# Patient Record
Sex: Male | Born: 1985 | Race: White | Hispanic: No | Marital: Single | Smoking: Never smoker
Health system: Southern US, Community
[De-identification: ages and names within clinical notes are randomized; demographics above are authoritative.]

## PROBLEM LIST (undated history)

## (undated) DIAGNOSIS — I10 Essential (primary) hypertension: Secondary | ICD-10-CM

## (undated) DIAGNOSIS — J45909 Unspecified asthma, uncomplicated: Secondary | ICD-10-CM

## (undated) DIAGNOSIS — F4310 Post-traumatic stress disorder, unspecified: Secondary | ICD-10-CM

## (undated) DIAGNOSIS — F419 Anxiety disorder, unspecified: Secondary | ICD-10-CM

## (undated) HISTORY — PX: NO PAST SURGERIES: SHX2092

---

## 2009-09-30 ENCOUNTER — Emergency Department (HOSPITAL_COMMUNITY): Admission: EM | Admit: 2009-09-30 | Discharge: 2009-10-01 | Payer: Self-pay | Admitting: Emergency Medicine

## 2012-03-23 ENCOUNTER — Emergency Department (HOSPITAL_COMMUNITY)

## 2012-03-23 ENCOUNTER — Emergency Department (HOSPITAL_COMMUNITY)
Admission: EM | Admit: 2012-03-23 | Discharge: 2012-03-23 | Disposition: A | Discharge status: Home / Self Care | Attending: Emergency Medicine | Admitting: Emergency Medicine

## 2012-03-23 ENCOUNTER — Encounter (HOSPITAL_COMMUNITY): Payer: Self-pay | Admitting: Family Medicine

## 2012-03-23 DIAGNOSIS — IMO0002 Reserved for concepts with insufficient information to code with codable children: Secondary | ICD-10-CM | POA: Insufficient documentation

## 2012-03-23 DIAGNOSIS — Y9389 Activity, other specified: Secondary | ICD-10-CM | POA: Insufficient documentation

## 2012-03-23 LAB — CBC WITH DIFFERENTIAL/PLATELET
Basophils Absolute: 0 10*3/uL (ref 0.0–0.1)
Eosinophils Absolute: 0.1 10*3/uL (ref 0.0–0.7)
HCT: 46.2 % (ref 39.0–52.0)
Hemoglobin: 16.8 g/dL (ref 13.0–17.0)
Lymphs Abs: 1.6 10*3/uL (ref 0.7–4.0)
MCV: 87.8 fL (ref 78.0–100.0)
Monocytes Absolute: 0.4 10*3/uL (ref 0.1–1.0)
RDW: 12.6 % (ref 11.5–15.5)

## 2012-03-23 LAB — POCT I-STAT, CHEM 8
Calcium, Ion: 1.19 mmol/L (ref 1.12–1.23)
Chloride: 103 mEq/L (ref 96–112)
Glucose, Bld: 127 mg/dL — ABNORMAL HIGH (ref 70–99)
Hemoglobin: 16.3 g/dL (ref 13.0–17.0)
Potassium: 3.9 mEq/L (ref 3.5–5.1)
Sodium: 141 mEq/L (ref 135–145)

## 2012-03-23 LAB — COMPREHENSIVE METABOLIC PANEL
Albumin: 4.3 g/dL (ref 3.5–5.2)
Alkaline Phosphatase: 86 U/L (ref 39–117)
CO2: 29 mEq/L (ref 19–32)
Calcium: 9.4 mg/dL (ref 8.4–10.5)
Chloride: 101 mEq/L (ref 96–112)
Creatinine, Ser: 0.88 mg/dL (ref 0.50–1.35)
GFR calc Af Amer: >90 mL/min (ref >90)
Sodium: 138 mEq/L (ref 135–145)
Total Bilirubin: 0.6 mg/dL (ref 0.3–1.2)

## 2012-03-23 MED ORDER — MORPHINE SULFATE 4 MG/ML IJ SOLN: 4.0000 mg | Freq: Once | INTRAMUSCULAR | Status: DC

## 2012-03-23 MED ORDER — HYDROMORPHONE HCL PF 1 MG/ML IJ SOLN
1.0000 mg | Freq: Once | INTRAMUSCULAR | Status: AC
  Administered 2012-03-23: 1 mg via INTRAVENOUS

## 2012-03-23 MED ORDER — IOHEXOL 300 MG/ML  SOLN
80.0000 mL | Freq: Once | INTRAMUSCULAR | Status: AC | PRN
  Administered 2012-03-23: 80 mL via INTRAVENOUS

## 2012-03-23 MED ORDER — HYDROMORPHONE HCL PF 1 MG/ML IJ SOLN
1.0000 mg | Freq: Once | INTRAMUSCULAR | Status: DC
  Filled 2012-03-23: qty 1

## 2012-03-23 MED ORDER — HYDROMORPHONE HCL PF 1 MG/ML IJ SOLN
1.0000 mg | Freq: Once | INTRAMUSCULAR | Status: AC
  Administered 2012-03-23: 1 mg via INTRAVENOUS
  Filled 2012-03-23: qty 1

## 2012-03-23 MED ORDER — HYDROCODONE-ACETAMINOPHEN 5-500 MG PO TABS: 1.0000 | ORAL_TABLET | Freq: Four times a day (QID) | ORAL | Status: DC | PRN

## 2012-03-23 MED ORDER — ONDANSETRON HCL 4 MG/2ML IJ SOLN
4.0000 mg | Freq: Once | INTRAMUSCULAR | Status: DC
  Filled 2012-03-23: qty 2

## 2012-03-23 MED ORDER — ONDANSETRON HCL 4 MG/2ML IJ SOLN
4.0000 mg | Freq: Once | INTRAMUSCULAR | Status: AC
  Administered 2012-03-23: 4 mg via INTRAVENOUS
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
## 2012-03-23 NOTE — ED Provider Notes (Signed)
I saw and evaluated the patient, reviewed the resident's note and I agree with the findings and plan.   Shelda Jakes, MD 03/23/12 [phone]
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
## 2012-03-23 NOTE — ED Notes (Signed)
Transported to CT 

## 2012-03-23 NOTE — Progress Notes (Signed)
Orthopedic Tech Progress Note Patient Details:  Noah Ford 1985-10-02 [id number]  Ortho Devices Type of Ortho Device: Crutches Ortho Device/Splint Interventions: Application   Nikki Dom 03/23/2012, 6:19 PM
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
## 2012-03-25 ENCOUNTER — Encounter (HOSPITAL_COMMUNITY): Payer: Self-pay | Admitting: *Deleted

## 2012-03-25 ENCOUNTER — Emergency Department (HOSPITAL_COMMUNITY)
Admission: EM | Admit: 2012-03-25 | Discharge: 2012-03-25 | Disposition: A | Discharge status: Home / Self Care | Attending: Emergency Medicine | Admitting: Emergency Medicine

## 2012-03-25 ENCOUNTER — Emergency Department (HOSPITAL_COMMUNITY)

## 2012-03-25 DIAGNOSIS — M545 Low back pain, unspecified: Secondary | ICD-10-CM | POA: Insufficient documentation

## 2012-03-25 DIAGNOSIS — Y9239 Other specified sports and athletic area as the place of occurrence of the external cause: Secondary | ICD-10-CM | POA: Insufficient documentation

## 2012-03-25 DIAGNOSIS — M25519 Pain in unspecified shoulder: Secondary | ICD-10-CM | POA: Insufficient documentation

## 2012-03-25 DIAGNOSIS — Y92838 Other recreation area as the place of occurrence of the external cause: Secondary | ICD-10-CM | POA: Insufficient documentation

## 2012-03-25 DIAGNOSIS — Y9389 Activity, other specified: Secondary | ICD-10-CM | POA: Insufficient documentation

## 2012-03-25 DIAGNOSIS — R209 Unspecified disturbances of skin sensation: Secondary | ICD-10-CM | POA: Insufficient documentation

## 2012-03-25 DIAGNOSIS — M25559 Pain in unspecified hip: Secondary | ICD-10-CM | POA: Insufficient documentation

## 2012-03-25 MED ORDER — IBUPROFEN 200 MG PO TABS
800.0000 mg | ORAL_TABLET | Freq: Once | ORAL | Status: AC
  Administered 2012-03-25: 800 mg via ORAL
  Filled 2012-03-25: qty 4

## 2012-03-25 MED ORDER — IBUPROFEN 800 MG PO TABS: 800.0000 mg | ORAL_TABLET | Freq: Three times a day (TID) | ORAL | Status: DC

## 2012-03-25 NOTE — ED Notes (Signed)
Pt c/o constant 10/10 sharp pain unrelieved with Vicodin to left shoulder, left hip, left knee, and lower back, with intermittent numbness and tingling to left big toe. Pt A&Ox4, uses crutches to ambulate short distances. Family at bedside.
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
## 2012-03-26 NOTE — ED Provider Notes (Signed)
Medical screening examination/treatment/procedure(s) were performed by non-physician practitioner and as supervising physician I was immediately available for consultation/collaboration.  Bartlomiej Jenkinson, MD 03/26/12 2339 

## 2012-05-09 ENCOUNTER — Encounter (HOSPITAL_COMMUNITY): Payer: Self-pay | Admitting: Emergency Medicine

## 2012-05-09 ENCOUNTER — Emergency Department (HOSPITAL_COMMUNITY)
Admission: EM | Admit: 2012-05-09 | Discharge: 2012-05-10 | Disposition: A | Discharge status: Home / Self Care | Attending: Emergency Medicine | Admitting: Emergency Medicine

## 2012-05-09 DIAGNOSIS — R45851 Suicidal ideations: Secondary | ICD-10-CM

## 2012-05-09 DIAGNOSIS — I10 Essential (primary) hypertension: Secondary | ICD-10-CM | POA: Insufficient documentation

## 2012-05-09 DIAGNOSIS — Z8659 Personal history of other mental and behavioral disorders: Secondary | ICD-10-CM | POA: Insufficient documentation

## 2012-05-09 DIAGNOSIS — F329 Major depressive disorder, single episode, unspecified: Secondary | ICD-10-CM | POA: Insufficient documentation

## 2012-05-09 DIAGNOSIS — F3289 Other specified depressive episodes: Secondary | ICD-10-CM | POA: Insufficient documentation

## 2012-05-09 DIAGNOSIS — F4310 Post-traumatic stress disorder, unspecified: Secondary | ICD-10-CM | POA: Diagnosis present

## 2012-05-09 DIAGNOSIS — F411 Generalized anxiety disorder: Secondary | ICD-10-CM | POA: Insufficient documentation

## 2012-05-09 HISTORY — DX: Anxiety disorder, unspecified: F41.9

## 2012-05-09 HISTORY — DX: Essential (primary) hypertension: I10

## 2012-05-09 HISTORY — DX: Post-traumatic stress disorder, unspecified: F43.10

## 2012-05-09 LAB — CBC
HCT: 46.1 % (ref 39.0–52.0)
MCV: 86.8 fL (ref 78.0–100.0)
RBC: 5.31 MIL/uL (ref 4.22–5.81)
WBC: 5.7 10*3/uL (ref 4.0–10.5)

## 2012-05-09 LAB — RAPID URINE DRUG SCREEN, HOSP PERFORMED
Amphetamines: NOT DETECTED
Benzodiazepines: NOT DETECTED
Cocaine: NOT DETECTED

## 2012-05-09 LAB — BASIC METABOLIC PANEL
BUN: 16 mg/dL (ref 6–23)
CO2: 31 mEq/L (ref 19–32)
Chloride: 99 mEq/L (ref 96–112)
Creatinine, Ser: 0.84 mg/dL (ref 0.50–1.35)

## 2012-05-09 LAB — URINALYSIS, ROUTINE W REFLEX MICROSCOPIC
Bilirubin Urine: NEGATIVE
Glucose, UA: NEGATIVE mg/dL
Ketones, ur: NEGATIVE mg/dL
Leukocytes, UA: NEGATIVE
Protein, ur: NEGATIVE mg/dL

## 2012-05-09 LAB — ETHANOL: Alcohol, Ethyl (B): <11 mg/dL (ref 0–11)

## 2012-05-09 MED ORDER — ONDANSETRON HCL 4 MG PO TABS: 4.0000 mg | ORAL_TABLET | Freq: Three times a day (TID) | ORAL | Status: DC | PRN

## 2012-05-09 MED ORDER — LORAZEPAM 1 MG PO TABS
1.0000 mg | ORAL_TABLET | Freq: Three times a day (TID) | ORAL | Status: DC | PRN
  Administered 2012-05-09: 1 mg via ORAL
  Filled 2012-05-09: qty 1

## 2012-05-09 MED ORDER — ACETAMINOPHEN 325 MG PO TABS
650.0000 mg | ORAL_TABLET | ORAL | Status: DC | PRN
  Administered 2012-05-09: 650 mg via ORAL
  Filled 2012-05-09: qty 2

## 2012-05-09 MED ORDER — ALUM & MAG HYDROXIDE-SIMETH 200-200-20 MG/5ML PO SUSP: 30.0000 mL | ORAL | Status: DC | PRN

## 2012-05-09 MED ORDER — IBUPROFEN 600 MG PO TABS: 600.0000 mg | ORAL_TABLET | Freq: Three times a day (TID) | ORAL | Status: DC | PRN

## 2012-05-09 NOTE — ED Notes (Signed)
Spoke with [name], MSW, LCSW about patient.  She has been working with patient and the family concerning issues of depression and suicidal tendencies in this patient.  Wife feels that patient is capable of committing act of suicide as he  has made numerous threats about hanging himself or shooting himself in the head with a gun.  Pt minimizes the seriousness of his condition and social worker is recommending inpatient treatment and medication.  Patient is not taking any medication now, as his Wellbutrin caused him to be constipated.  Social worker reports he is refusing recommendations of individual therapy and medication management.  Pt has made the statement to [name] that if he could commit suicide without pain he would.

## 2012-05-09 NOTE — ED Notes (Signed)
Patient c/o headache.  650mg  Tylenol given po per prn order.

## 2012-05-09 NOTE — ED Notes (Addendum)
Patient requested something to help him sleep.  Ativan given per prn order.

## 2012-05-09 NOTE — ED Notes (Signed)
Per patient came home from [place], divorced then remarried-recently lost job-has been treated for PTSD, anger issues-having suicidal thoughts r/t these stressors

## 2012-05-09 NOTE — ED Provider Notes (Signed)
Medical screening examination/treatment/procedure(s) were performed by non-physician practitioner and as supervising physician I was immediately available for consultation/collaboration.   Dione Booze, MD 05/09/12 1650

## 2012-05-09 NOTE — ED Notes (Signed)
[facility] team at bedside.  

## 2012-05-09 NOTE — ED Provider Notes (Signed)
History     CSN: [id number]  Arrival date & time 05/09/12  1358   First MD Initiated Contact with Patient 05/09/12 1434      Chief Complaint  Patient presents with  . Medical Clearance    (Consider location/radiation/quality/duration/timing/severity/associated sxs/prior treatment) HPI Comments: Patient presents with complaint of suicidal ideations from stresses that he has with family, work. Patient has been on antidepressant Wellbutrin in the past and has stopped taking this. He has a diagnosis of PTSD. The patient and his wife saw a counselor today and was told that he needed to go to the emergency department for emergency treatment. He is currently here voluntarily. He denies current medical complaints other than a resolving cough from a recent episode of bronchitis. He denies drug or alcohol use. Onset of symptoms insidious. Course is constant. Nothing makes symptoms better or worse.  The history is provided by the patient.    Past Medical History  Diagnosis Date  . Post traumatic stress disorder (PTSD)   . Hypertension     History reviewed. No pertinent past surgical history.  No family history on file.  History  Substance Use Topics  . Smoking status: Never Smoker   . Smokeless tobacco: Not on file  . Alcohol Use: No      Review of Systems  Constitutional: Negative for fever.  HENT: Negative for sore throat and rhinorrhea.   Eyes: Negative for redness.  Respiratory: Positive for cough.   Cardiovascular: Negative for chest pain.  Gastrointestinal: Negative for nausea, vomiting, abdominal pain and diarrhea.  Genitourinary: Negative for dysuria.  Musculoskeletal: Negative for myalgias.  Skin: Negative for rash.  Neurological: Negative for headaches.  Psychiatric/Behavioral: Positive for suicidal ideas.    Allergies  Review of patient's allergies indicates no known allergies.  Home Medications   Current Outpatient Rx  Name  Route  Sig  Dispense  Refill  .  ALBUTEROL SULFATE HFA 108 (90 BASE) MCG/ACT IN AERS   Inhalation   Inhale 2 puffs into the lungs every 6 (six) hours as needed. For shortness of breath or wheezing         . HYDROCODONE-ACETAMINOPHEN 5-500 MG PO TABS   Oral   Take 1 tablet by mouth every 6 (six) hours as needed. For pain         . IBUPROFEN 800 MG PO TABS   Oral   Take 1 tablet (800 mg total) by mouth 3 (three) times daily.   21 tablet   0     BP 140/89  Pulse 90  Temp 98.4 F (36.9 C) (Oral)  Resp 18  SpO2 100%  Physical Exam  Nursing note and vitals reviewed. Constitutional: He appears well-developed and well-nourished.  HENT:  Head: Normocephalic and atraumatic.  Eyes: Conjunctivae normal are normal. Right eye exhibits no discharge. Left eye exhibits no discharge.  Neck: Normal range of motion. Neck supple.  Cardiovascular: Normal rate, regular rhythm and normal heart sounds.   Pulmonary/Chest: Effort normal and breath sounds normal.  Abdominal: Soft. There is no tenderness.  Neurological: He is alert.  Skin: Skin is warm and dry.  Psychiatric: He has a normal mood and affect. He expresses suicidal ideation. He expresses no homicidal ideation. He expresses suicidal plans. He expresses no homicidal plans.    ED Course  Procedures (including critical care time)  Labs Reviewed  CBC - Abnormal; Notable for the following:    MCHC 36.9 (*)     All other components within normal limits  URINE RAPID DRUG SCREEN (HOSP PERFORMED)  URINALYSIS, ROUTINE W REFLEX MICROSCOPIC  BASIC METABOLIC PANEL  ETHANOL   No results found.   1. Suicidal ideation     3:02 PM Patient seen and examined. Work-up initiated.    Vital signs reviewed and are as follows: Filed Vitals:   05/09/12 1409  BP: 140/89  Pulse: 90  Temp: 98.4 F (36.9 C)  Resp: 18   3:42 PM ACT aware of patient and will see. Holding orders completed.   3:47 PM Handoff to Dr. Rubin Payor.   MDM  SI, patient is here  voluntarily.        Renne Crigler, [place] 05/09/12 1543  Renne Crigler, [place] 05/09/12 1547

## 2012-05-09 NOTE — BH Assessment (Signed)
Assessment Note   Noah Ford is a 27 y.o. male who presents to emerg dept at the request of his therapist(Holly Laffer).  Pt and spouse met with therapist today and pt made comments of self harm.  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  Pt says he then had SI thoughts off and on but recently SI thoughts have increased--"I just don't care anymore".  Pt says---"if I were going to do it I would overdose".  Pt says he doesn't want to harm self but doesn't know what else to do to make the pain of his military experiences to stop.  Pt says he feels like he's been pushed through the system, not receiving the help he needs to return to a normal life.  Pt says these issues have caused relational issues with both his marriages(pt is in his 2nd marriage).  Pt says he has 2 step children and his wife is currently pregnant and he is having a difficult time being a father.  Pt reports a past hx of heavy alcohol use after returning from [place], but denies any current SA use.  Pt also denies HI/Psych.  Pt has severe anxiety and describes sxs to this writer: chest pain, racing pulse, sweats, cannot be in large groups of people.  Pt says he doesn't like to be touched, is easily startled and is always looking "over his shoulder".  Pt says he is constantly checking anything with a door and re-checks several times until he's sure things are secure. Pt denies nightmares, night terrors or flashbacks.  Pt admits to increased anger(snaps at the slightest things), frustration, but says he can control these issues to an extent by walking away.  Pt is not currently prescribed any medications. During assessment, pt exhibits, restlessness, moderate anxiety(unable to sit still, excessive hand movements, excessive eye blinking).  Pt is pleasant and respectful.     Axis I:  Anxiety Disorder NOS and Post Traumatic Stress Disorder Axis II: Deferred Axis III:  Past Medical History  Diagnosis Date  . Post traumatic stress disorder (PTSD)   . Hypertension   . Anxiety    Axis IV: other psychosocial or environmental problems, problems related to social environment and problems with primary support group Axis V: 31-40 impairment in reality testing  Past Medical History:  Past Medical History  Diagnosis Date  . Post traumatic stress disorder (PTSD)   . Hypertension   . Anxiety     History reviewed. No pertinent past surgical history.  Family History: No family history on file.  Social History:  reports that he has never smoked. He does not have any smokeless tobacco history on file. He reports that he does not drink alcohol or use illicit drugs.  Additional Social History:  Alcohol / Drug Use Pain Medications: None  Prescriptions: None  Over the Counter: None  History of alcohol / drug use?: Yes Longest period of sobriety (when/how long): Past hx of heavy drinking just after 1 tour in [place]; no longer drinking, denies any current SA  CIWA: CIWA-Ar BP: 143/78 mmHg Pulse Rate: 63  COWS:    Allergies: No Known Allergies  Home Medications:  (Not in a hospital admission)  OB/GYN Status:  No LMP for male patient.  General Assessment Data Location of Assessment: [facility] Living Arrangements: Spouse/significant other;Children Can pt return to current living arrangement?: Yes  Admission Status: Voluntary Is patient capable of signing voluntary admission?: Yes Transfer from: Acute Hospital Referral Source: MD  Education Status Is patient currently in school?: No Highest grade of school patient has completed: None  Name of school: None  Contact person: None   Risk to self Suicidal Ideation: Yes-Currently Present Suicidal Intent: No-Not Currently/Within Last 6 Months Is patient at risk for suicide?: Yes Suicidal Plan?: Yes-Currently  Present Specify Current Suicidal Plan: "If I were going to to do it, I would overdose".  Access to Means: Yes Specify Access to Suicidal Means: Pills  What has been your use of drugs/alcohol within the last 12 months?: Past hx of heavy alcohol use; Denies current use  Previous Attempts/Gestures: No How many times?: 0  Other Self Harm Risks: None  Triggers for Past Attempts: None known Intentional Self Injurious Behavior: None Family Suicide History: No Recent stressful life event(s): Other (Comment) (Wife is pregnant; Previous tours in [place] and [place] ) Persecutory voices/beliefs?: No Depression: Yes Depression Symptoms: Despondent;Insomnia;Isolating;Fatigue;Loss of interest in usual pleasures;Feeling worthless/self pity Substance abuse history and/or treatment for substance abuse?: No Suicide prevention information given to non-admitted patients: Not applicable  Risk to Others Homicidal Ideation: No Thoughts of Harm to Others: No Current Homicidal Intent: No Current Homicidal Plan: No Access to Homicidal Means: No Identified Victim: None  History of harm to others?: No Assessment of Violence: None Noted Violent Behavior Description: None  Does patient have access to weapons?: No Criminal Charges Pending?: No Does patient have a court date: No  Psychosis Hallucinations: None noted Delusions: None noted  Mental Status Report Appear/Hygiene: Other (Comment) (Appropriate ) Eye Contact: Good Motor Activity: Restlessness Speech: Logical/coherent Level of Consciousness: Alert Mood: Anxious;Sad;Depressed Affect: Anxious;Depressed;Sad Anxiety Level: Moderate Thought Processes: Coherent;Relevant Judgement: Impaired Orientation: Person;Place;Time;Situation Obsessive Compulsive Thoughts/Behaviors: None  Cognitive Functioning Concentration: Normal Memory: Recent Intact;Remote Intact IQ: Average Insight: Poor Impulse Control: Fair Appetite: Good Weight Loss: 0   Weight Gain: 0  Sleep: Decreased Total Hours of Sleep: 4  Vegetative Symptoms: Decreased grooming;Not bathing  ADLScreening [facility] Assessment Services) Patient's cognitive ability adequate to safely complete daily activities?: Yes Patient able to express need for assistance with ADLs?: Yes Independently performs ADLs?: Yes (appropriate for developmental age)  Abuse/Neglect [facility]) Physical Abuse: Denies Verbal Abuse: Denies Sexual Abuse: Denies  Prior Inpatient Therapy Prior Inpatient Therapy: No Prior Therapy Dates: None  Prior Therapy Facilty/Provider(s): None  Reason for Treatment: None   Prior Outpatient Therapy Prior Outpatient Therapy: Yes Prior Therapy Dates: Various dates  Prior Therapy Facilty/Provider(s): [facility]  Reason for Treatment: Therapy--1st visit today   ADL Screening (condition at time of admission) Patient's cognitive ability adequate to safely complete daily activities?: Yes Patient able to express need for assistance with ADLs?: Yes Independently performs ADLs?: Yes (appropriate for developmental age) Weakness of Legs: None Weakness of Arms/Hands: None  Home Assistive Devices/Equipment Home Assistive Devices/Equipment: None  Therapy Consults (therapy consults require a physician order) PT Evaluation Needed: No OT Evalulation Needed: No SLP Evaluation Needed: No Abuse/Neglect Assessment (Assessment to be complete while patient is alone) Physical Abuse: Denies Verbal Abuse: Denies Sexual Abuse: Denies Exploitation of patient/patient's resources: Denies Self-Neglect: Denies Values / Beliefs Cultural Requests During Hospitalization: None Spiritual Requests During Hospitalization: None Consults Spiritual Care Consult Needed: No Social Work Consult Needed: No Merchant navy officer (For Healthcare) Advance Directive: Patient does not have advance directive;Patient would not like information Pre-existing out of facility DNR order (yellow form or  pink MOST form): No Nutrition Screen-  [facility] Patient's home diet: Regular Have you recently lost weight without trying?: No Have you been eating poorly because of a decreased appetite?: No Malnutrition Screening Tool Score: 0   Additional Information 1:1 In Past 12 Months?: No CIRT Risk: No Elopement Risk: No Does patient have medical clearance?: Yes     Disposition:  Disposition Disposition of Patient: Inpatient treatment program;Referred to [facility] ) Type of inpatient treatment program: Adult Patient referred to: Other (Comment) [facility] )  On Site Evaluation by:   Reviewed with Physician:     Murrell Redden 05/09/2012 11:13 PM

## 2012-05-10 ENCOUNTER — Encounter (HOSPITAL_COMMUNITY): Payer: Self-pay | Admitting: *Deleted

## 2012-05-10 ENCOUNTER — Inpatient Hospital Stay (HOSPITAL_COMMUNITY)
Admission: AD | Admit: 2012-05-10 | Discharge: 2012-05-14 | DRG: 882 | Disposition: A | Discharge status: Home / Self Care | Source: Ambulatory Visit | Attending: Psychiatry | Admitting: Psychiatry

## 2012-05-10 DIAGNOSIS — Z79899 Other long term (current) drug therapy: Secondary | ICD-10-CM

## 2012-05-10 DIAGNOSIS — G4700 Insomnia, unspecified: Secondary | ICD-10-CM | POA: Diagnosis present

## 2012-05-10 DIAGNOSIS — F329 Major depressive disorder, single episode, unspecified: Secondary | ICD-10-CM

## 2012-05-10 DIAGNOSIS — F3289 Other specified depressive episodes: Secondary | ICD-10-CM | POA: Diagnosis present

## 2012-05-10 DIAGNOSIS — F32A Depression, unspecified: Secondary | ICD-10-CM | POA: Diagnosis present

## 2012-05-10 DIAGNOSIS — I10 Essential (primary) hypertension: Secondary | ICD-10-CM | POA: Diagnosis present

## 2012-05-10 DIAGNOSIS — J45909 Unspecified asthma, uncomplicated: Secondary | ICD-10-CM | POA: Diagnosis present

## 2012-05-10 DIAGNOSIS — F4310 Post-traumatic stress disorder, unspecified: Secondary | ICD-10-CM

## 2012-05-10 HISTORY — DX: Unspecified asthma, uncomplicated: J45.909

## 2012-05-10 MED ORDER — ACETAMINOPHEN 325 MG PO TABS: 650.0000 mg | ORAL_TABLET | Freq: Four times a day (QID) | ORAL | Status: DC | PRN

## 2012-05-10 MED ORDER — MAGNESIUM HYDROXIDE 400 MG/5ML PO SUSP: 30.0000 mL | Freq: Every day | ORAL | Status: DC | PRN

## 2012-05-10 MED ORDER — ALUM & MAG HYDROXIDE-SIMETH 200-200-20 MG/5ML PO SUSP: 30.0000 mL | ORAL | Status: DC | PRN

## 2012-05-10 NOTE — Progress Notes (Signed)
Psychoeducational Group Note  Date:  05/10/2012 Time:  2000  Group Topic/Focus:  Wrap-Up Group:   The focus of this group is to help patients review their daily goal of treatment and discuss progress on daily workbooks.  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  Gwendy Boeder, Newton Pigg 05/10/2012, 9:29 PM

## 2012-05-10 NOTE — Consult Note (Signed)
Reason for Consult: Depression, anxiety, and suicidal thoughts Referring Physician: Dr. Gilman Ford Noah Ford is an 27 y.o. male.  HPI: Patient was seen and chart reviewed. Patient presented with with the symptoms of for depression, anxiety, suicidal ideation. Patient reported that he has been returning from a, quite as a national [employer] after 15 months and than was diagnosed with the posttraumatic stress disorder by [place] screen. Patient was seen Dr. Lenoria Ford at the [facility] and prescribed Wellbutrin, which was taken 2 weeks, reportedly discontinued because of constipation. Patient has seen [facility] laugh for a year. ER from the charts. Social services therapist for 2 months without significant changes in his emotions. Patient therapist recommended him to obtain psychiatric assessment. In the hospital setting. Patient continued to have a suicidal thoughts unable to contract for safety. Patient has problem with the relationships disagreement with his wife regarding, raising his 2 steps children, and his wife also pregnant with 4 months gestation, and has financial problems. 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 Patient has no previous history of behavioral health hospitalization. Patient failed to respond to outpatient psychiatric services. Patient urine drug screen was negative for drug of abuse.  MSE: Patient the was calm, quite cooperative. Stated mood is depression. His affect was constricted. Has a normal speech with low volume is linear and goal-directed thoughts. Continued to have suicidal thoughts without a specific plan, is no homicidal ideation, intention, or plan. He has no evidence of psychotic symptoms.  Past Medical History  Diagnosis Date  . Post traumatic stress disorder (PTSD)   . Hypertension   . Anxiety     History reviewed. No pertinent past surgical history.  No family history on file.  Social History:  reports that he has never smoked. He does not have  any smokeless tobacco history on file. He reports that he does not drink alcohol or use illicit drugs.  Allergies: No Known Allergies  Medications: I have reviewed the patient's current medications.  Results for orders placed during the hospital encounter of 05/09/12 (from the past 48 hour(s))  URINE RAPID DRUG SCREEN (HOSP PERFORMED)     Status: Normal   Collection Time   05/09/12  2:59 PM      Component Value Range Comment   Opiates NONE DETECTED  NONE DETECTED    Cocaine NONE DETECTED  NONE DETECTED    Benzodiazepines NONE DETECTED  NONE DETECTED    Amphetamines NONE DETECTED  NONE DETECTED    Tetrahydrocannabinol NONE DETECTED  NONE DETECTED    Barbiturates NONE DETECTED  NONE DETECTED   URINALYSIS, ROUTINE W REFLEX MICROSCOPIC     Status: Normal   Collection Time   05/09/12  2:59 PM      Component Value Range Comment   Color, Urine YELLOW  YELLOW    APPearance CLEAR  CLEAR    Specific Gravity, Urine 1.027  1.005 - 1.030    pH 5.5  5.0 - 8.0    Glucose, UA NEGATIVE  NEGATIVE mg/dL    Hgb urine dipstick NEGATIVE  NEGATIVE    Bilirubin Urine NEGATIVE  NEGATIVE    Ketones, ur NEGATIVE  NEGATIVE mg/dL    Protein, ur NEGATIVE  NEGATIVE mg/dL    Urobilinogen, UA 0.2  0.0 - 1.0 mg/dL    Nitrite NEGATIVE  NEGATIVE    Leukocytes, UA NEGATIVE  NEGATIVE MICROSCOPIC NOT DONE ON URINES WITH NEGATIVE PROTEIN, BLOOD, LEUKOCYTES, NITRITE, OR GLUCOSE <1000 mg/dL.  CBC  Status: Abnormal   Collection Time   05/09/12  3:13 PM      Component Value Range Comment   WBC 5.7  4.0 - 10.5 K/uL    RBC 5.31  4.22 - 5.81 MIL/uL    Hemoglobin 17.0  13.0 - 17.0 g/dL    HCT 16.1  09.6 - 04.5 %    MCV 86.8  78.0 - 100.0 fL    MCH 32.0  26.0 - 34.0 pg    MCHC 36.9 (*) 30.0 - 36.0 g/dL    RDW 40.9  81.1 - 91.4 %    Platelets 214  150 - 400 K/uL   BASIC METABOLIC PANEL     Status: Normal   Collection Time   05/09/12  3:13 PM      Component Value Range Comment   Sodium 137  135 - 145 mEq/L    Potassium  3.8  3.5 - 5.1 mEq/L    Chloride 99  96 - 112 mEq/L    CO2 31  19 - 32 mEq/L    Glucose, Bld 83  70 - 99 mg/dL    BUN 16  6 - 23 mg/dL    Creatinine, Ser 7.82  0.50 - 1.35 mg/dL    Calcium 95.6  8.4 - 10.5 mg/dL    GFR calc non Af Amer >90  >90 mL/min    GFR calc Af Amer >90  >90 mL/min   ETHANOL     Status: Normal   Collection Time   05/09/12  3:13 PM      Component Value Range Comment   Alcohol, Ethyl (B) <11  0 - 11 mg/dL     No results found.  Positive for anorexia, anxiety, bad mood, depression, sleep disturbance and Multiple psychosocial stressors Blood pressure 143/88, pulse 74, temperature 97.9 F (36.6 C), temperature source Oral, resp. rate 19, SpO2 99.00%.   Assessment/Plan: Posttraumatic stress disorder. Relationship problems. Multiple psychosocial stressors.  Recommended acute psychiatric hospitalization for crisis stabilization and medication management.  Noah Ford,Noah R. 05/10/2012, 11:38 AM

## 2012-05-10 NOTE — Tx Team (Signed)
Initial Interdisciplinary Treatment Plan  PATIENT STRENGTHS: (choose at least two) Ability for insight Average or above average intelligence Communication skills Motivation for treatment/growth Supportive family/friends  PATIENT STRESSORS: PTSD   PROBLEM LIST: Problem List/Patient Goals Date to be addressed Date deferred Reason deferred Estimated date of resolution  Depression 05-10-12     Aggression 05-10-12                                                DISCHARGE CRITERIA:  Ability to meet basic life and health needs Improved stabilization in mood, thinking, and/or behavior Motivation to continue treatment in a less acute level of care  PRELIMINARY DISCHARGE PLAN: Attend aftercare/continuing care group Outpatient therapy Return to previous living arrangement  PATIENT/FAMIILY INVOLVEMENT: This treatment plan has been presented to and reviewed with the patient, Noah Ford, and/or family member.  The patient and family have been given the opportunity to ask questions and make suggestions.  Noah Ford 05/10/2012, 3:36 PM

## 2012-05-10 NOTE — ED Provider Notes (Signed)
Patient resting comfortably without current problems. Awaiting evaluation for placement/disposition.   Noah Ford J. Ronette Hank, MD 05/10/12 [id number] 

## 2012-05-11 DIAGNOSIS — F4310 Post-traumatic stress disorder, unspecified: Principal | ICD-10-CM

## 2012-05-11 MED ORDER — SERTRALINE HCL 50 MG PO TABS
50.0000 mg | ORAL_TABLET | Freq: Every day | ORAL | Status: DC
  Administered 2012-05-11 – 2012-05-14 (×4): 50 mg via ORAL
  Filled 2012-05-11 (×3): qty 1
  Filled 2012-05-11: qty 4
  Filled 2012-05-11 (×3): qty 1

## 2012-05-11 MED ORDER — TRAZODONE HCL 50 MG PO TABS
50.0000 mg | ORAL_TABLET | Freq: Every day | ORAL | Status: DC
  Administered 2012-05-11 – 2012-05-13 (×3): 50 mg via ORAL
  Filled 2012-05-11: qty 4
  Filled 2012-05-11 (×6): qty 1

## 2012-05-11 NOTE — Progress Notes (Signed)
Date: 05/11/2012  Time: 0900  Group Topic/Focus:  Goal setting group  Participation Level: Attended with good participation  Participation Quality: attentive   Affect: flat depressed  Cognitive: wnl  Insight: limited  Engagement in Group:  Additional Comments: .

## 2012-05-11 NOTE — H&P (Signed)
I agree with major elements of this evaluation. 
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
## 2012-05-11 NOTE — Clinical Social Work Psychosocial (Signed)
[facility] Group Notes:  (Clinical Social Work)  05/11/2012   3:00-4:00PM  Summary of Progress/Problems:   The main focus of today's process group was for the patient to identify ways in which they have in the past sabotaged their own recovery and reasons they may have done this/what they received from doing it.  We then worked to identify a specific plan to avoid doing this when discharged from the hospital for this admission.  The discussion evolved into diagnosis education, and a concern about advocating for oneself as a patient.  In response to patient concerns, CSW encouraged patients to know about their diagnoses as well as prescribed medications, and informed them they could obtain written material from their RN.  The patient expressed some anxiety and depression, talked about not even thinking something was wrong with him until his latest return from tour of duty when he realized that he was having emotions and thoughts that were not his norm.  Type of Therapy:  Group Therapy - Process  Participation Level:  Active  Participation Quality:  Attentive and Sharing  Affect:  Anxious and constantly moving in chair  Cognitive:  Appropriate and Oriented  Insight:  Engaged  Engagement in Therapy:  Engaged  Modes of Intervention:  Clarification, Education, Limit-setting, Problem-solving, Socialization, Support and Processing, Exploration, Discussion   Ambrose Mantle, LCSW 05/11/2012, 5:28 PM

## 2012-05-11 NOTE — Progress Notes (Signed)
D: Pt in bed resting with eyes closed. Respirations even and unlabored. Pt appears to be in no signs of distress at this time. A: Q15min checks remains for this pt. R: Pt remains safe at this time.   

## 2012-05-11 NOTE — [facility] Suicide Risk Assessment (Signed)
Suicide Risk Assessment  Admission Assessment     Nursing information obtained from:  Patient Demographic factors:  Male;Adolescent or young adult;Caucasian;Unemployed Current Mental Status:  NA Loss Factors:  Financial problems / change in socioeconomic status Historical Factors:  Prior suicide attempts;Impulsivity Risk Reduction Factors:  Responsible for children under 25 years of age;Sense of responsibility to family;Living with another person, especially a relative;Positive social support  CLINICAL FACTORS:   Depression:   Hopelessness Insomnia Dysthymia  COGNITIVE FEATURES THAT CONTRIBUTE TO RISK:  Polarized thinking    SUICIDE RISK:   Minimal: No identifiable suicidal ideation.  Patients presenting with no risk factors but with morbid ruminations; may be classified as minimal risk based on the severity of the depressive symptoms  PLAN OF CARE: Please see the psychiatric assessment note and complete mental status examination.  Noah Ford T. 05/11/2012, 2:42 PM

## 2012-05-11 NOTE — Progress Notes (Signed)
D:Pt showing good insight stating that he wants to work on his anger management and needing medication to assist him as his feelings are starting to surface related to being in [place]. He reports that he has much to live for including his wife, step children and a child on the way. He talked about how he was before being in the service and how he has increased stress and anger since being in the service. Pt said that he tried taking Wellbutrin for two weeks and had to stop because of stomach issues. He said that he has not felt supported from the [place] since returning from [place]. A:Encouraged pt to discuss feelings. Offered support, groups, and 15 minute checks. R:Pt denies si and hi. He is interacting with staff and peers. Safety maintained on the unit.

## 2012-05-11 NOTE — Progress Notes (Signed)
[facility] Group Notes:  (Counselor/Nursing/MHT/Case Management/Adjunct)  05/11/2012 11:54 PM  Type of Therapy:  Psychoeducational Skills  Participation Level:  Active  Participation Quality:  Appropriate  Affect:  Appropriate  Cognitive:  Appropriate  Insight:  Improving  Engagement in Group:  Improving  Engagement in Therapy:  Improving  Modes of Intervention:  Education  Summary of Progress/Problems: The patient verbalized that he had a good day overall for a number of reasons. First of all, he stated that he was visited by his wife today. Secondly, he stated that he attended groups and enjoyed them. His goal for tomorrow is to figure out how to cope with his stress, irritability, and PTSD.    Hazle Coca S 05/11/2012, 11:54 PM

## 2012-05-12 NOTE — Progress Notes (Signed)
Patient ID: Noah Ford, male   DOB: 23-Aug-1985, 27 y.o.   MRN: [id number] D)  Has been out on hall interacting appropriately with staff and peers.  Attended group, came to med window afterward, compliant with meds.  A)  Will continue to monitor for safety R)  Safety maintained.

## 2012-05-12 NOTE — Clinical Social Work Psychosocial (Signed)
[facility] Group Notes:  (Clinical Social Work)  05/12/2012     Summary of Progress/Problems:   Summary of Progress/Problems:   The main focus of today's process group was for the patient to define "support" and describe what healthy supports are, then to identify the patient's current support system and decide on other supports that can be put in place to prevent future hospitalizations.  Roleplay was used to demonstrate definitions of different types of available supports.  An emphasis was placed on using therapist, doctor, therapy groups, self-help groups and problem-specific support groups to expand supports. The patient expressed that his supports are fair, and that he will be adding the [facility] and [facility] as additional supports.  Type of Therapy:  Process Group  Participation Level:  Active  Participation Quality:  Attentive  Affect:  Blunted and Depressed  Cognitive:  Appropriate and Oriented  Insight:  Engaged  Engagement in Therapy:  Engaged  Modes of Intervention:  Clarification, Education, Limit-setting, Problem-solving, Socialization, Support and Processing, Exploration, Discussion, Role-Play   Ambrose Mantle, LCSW 05/12/2012, 12:12 PM

## 2012-05-12 NOTE — Progress Notes (Signed)
D-Patient active on the unit and attending the scheduled groups. Denies SI. Rates depression/hopelessness at a 1. Patient observed interacting with peers a great deal. Appears somewhat animated and anxious. A-Patient would like to "seek outpatient treatment and other group therapy." Patient reports having a great day and is starting to feel a great deal of improvement in his mood. Stated that "I feel happy today." Observed to have a bright affect during interactions with staff.  R-Continue to monitor for signs of improvement and support patient. Remains safe on the unit.

## 2012-05-12 NOTE — Progress Notes (Signed)
[facility] MD Progress Note  05/12/2012 11:33 AM Noah Ford  MRN:  [id number] Subjective:  I'm doing better.  I'm sleeping better with trazodone.  I still feel sad and depressed but no suicidal thoughts.  My nightmares are less intense from the past. Diagnosis:  Axis I: Post Traumatic Stress Disorder  ADL's:  Intact  Sleep: Fair  Appetite:  Fair  Suicidal Ideation:  Plan:  Denies any suicidal thoughts or plan Homicidal Ideation:  Plan:  Denies any homicidal thoughts or plan AEB (as evidenced by):  Psychiatric Specialty Exam: Review of Systems  Musculoskeletal: Negative.   Neurological: Negative.   Psychiatric/Behavioral: The patient is nervous/anxious and has insomnia.     Blood pressure 142/90, pulse 59, temperature 97.7 F (36.5 C), temperature source Oral, resp. rate 18, height 6\' 2"  (1.88 m).There is no weight on file to calculate BMI.  General Appearance: Casual  Eye Contact::  Fair  Speech:  Normal Rate  Volume:  Normal  Mood:  Anxious and Depressed  Affect:  Constricted and Depressed  Thought Process:  Linear and Logical  Orientation:  Full (Time, Place, and Person)  Thought Content:  Rumination  Suicidal Thoughts:  No  Homicidal Thoughts:  No  Memory:  Good  Judgement:  Fair  Insight:  Fair  Psychomotor Activity:  Decreased  Concentration:  Fair  Recall:  Fair  Akathisia:  No  Handed:  Right  AIMS (if indicated):     Assets:  Desire for Improvement Housing Physical Health Social Support  Sleep:  Number of Hours: 6.75    Current Medications: Current Facility-Administered Medications  Medication Dose Route Frequency Provider Last Rate Last Dose  . acetaminophen (TYLENOL) tablet 650 mg  650 mg Oral Q6H PRN Shuvon Rankin, NP      . alum & mag hydroxide-simeth (MAALOX/MYLANTA) 200-200-20 MG/5ML suspension 30 mL  30 mL Oral Q4H PRN Shuvon Rankin, NP      . magnesium hydroxide (MILK OF MAGNESIA) suspension 30 mL  30 mL Oral Daily PRN Shuvon Rankin, NP      .  sertraline (ZOLOFT) tablet 50 mg  50 mg Oral Daily Verne Spurr, PA-C   50 mg at 05/12/12 0835  . traZODone (DESYREL) tablet 50 mg  50 mg Oral QHS Verne Spurr, PA-C   50 mg at 05/11/12 2154    Lab Results: No results found for this or any previous visit (from the past 48 hour(s)).  Physical Findings: AIMS: Facial and Oral Movements Muscles of Facial Expression: None, normal Lips and Perioral Area: None, normal Jaw: None, normal Tongue: None, normal,Extremity Movements Upper (arms, wrists, hands, fingers): None, normal Lower (legs, knees, ankles, toes): None, normal, Trunk Movements Neck, shoulders, hips: None, normal, Overall Severity Severity of abnormal movements (highest score from questions above): None, normal Incapacitation due to abnormal movements: None, normal Patient's awareness of abnormal movements (rate only patient's report): No Awareness, Dental Status Current problems with teeth and/or dentures?: No Does patient usually wear dentures?: No  CIWA:    COWS:     Treatment Plan Summary: Daily contact with patient to assess and evaluate symptoms and progress in treatment Medication management  Plan: Patient is doing better from the past.  He denies any side effects of medication.  His nightmares and flashback are less intense from the past.  I will continue his current psychiatric medication, encourage him to participate in group milieu therapy.  Continue crisis stabilization and current treatment plan to prevent relapse of his symptoms.  Medical Decision Making  Problem Points:  Established problem, stable/improving (1), Review of last therapy session (1) and Review of psycho-social stressors (1) Data Points:  Review or order clinical lab tests (1) Review of medication regiment & side effects (2)  I certify that inpatient services furnished can reasonably be expected to improve the patient's condition.   Asuna Peth T. 05/12/2012, 11:33 AM

## 2012-05-12 NOTE — Progress Notes (Signed)
Psychoeducational Group Note  Date:  05/12/2012 Time:  2000  Group Topic/Focus:  Wrap-Up Group:   The focus of this group is to help patients review their daily goal of treatment and discuss progress on daily workbooks.  Participation Level:  Active  Participation Quality:  Attentive  Affect:  Appropriate  Cognitive:  Appropriate  Insight:  Engaged  Engagement in Group:  Engaged  Additional Comments:  Patient shared that he was very happy today and that he did not know why.  Ariela Mochizuki, Newton Pigg 05/12/2012, 11:39 PM

## 2012-05-13 DIAGNOSIS — F329 Major depressive disorder, single episode, unspecified: Secondary | ICD-10-CM

## 2012-05-13 NOTE — Progress Notes (Signed)
Patient ID: Noah Ford, male   DOB: [birth year], 27 y.o.   MRN: [id number] D)  Has been in good spirits this evening, had a good visit with his wife, pleasant, smiling.  States he actually started the day feeling good and it just improved from there.  Feels the meds are working, feels he has learned from group, and feels he has gained from the program.  Attended group, compliant with meds. A)  Will continue to monitor q 15 minutes for safety, continue POC R)  Receptive, safety maintained.
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
## 2012-05-13 NOTE — Progress Notes (Signed)
[facility] MD Progress Note  05/13/2012 1:38 PM Noah Ford  MRN:  [id number] Subjective:  Patient reports feeling better. Mood improved. Has been sleeping well for past 2 nights. 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 Diagnosis:   Axis I: Depressive Disorder NOS Axis II: Deferred Axis III:  Past Medical History  Diagnosis Date  . Post traumatic stress disorder (PTSD)   . Hypertension   . Anxiety   . Asthma    Axis IV: occupational problems and other psychosocial or environmental problems Axis V: 51-60 moderate symptoms  ADL's:  Intact  Sleep: Fair  Appetite:  Fair  Suicidal Ideation:    Psychiatric Specialty Exam: Review of Systems  Constitutional: Negative.   HENT: Negative.   Eyes: Negative.   Respiratory: Negative.   Cardiovascular: Negative.   Gastrointestinal: Negative.   Genitourinary: Negative.   Musculoskeletal: Negative.   Skin: Negative.   Neurological: Negative.   Endo/Heme/Allergies: Negative.   Psychiatric/Behavioral: Positive for depression. The patient is nervous/anxious.     Blood pressure 132/87, pulse 64, temperature 98.3 F (36.8 C), temperature source Oral, resp. rate 16, height 6\' 2"  (1.88 m).There is no weight on file to calculate BMI.  General Appearance: Casual  Eye Contact::  Fair  Speech:  Clear and Coherent  Volume:  Normal  Mood:  Dysphoric  Affect:  Constricted  Thought Process:  Coherent  Orientation:  Full (Time, Place, and Person)  Thought Content:  WDL  Suicidal Thoughts:  No  Homicidal Thoughts:  No  Memory:  Immediate;   Fair Recent;   Fair Remote;   Fair  Judgement:  Fair  Insight:  Fair  Psychomotor Activity:  Normal  Concentration:  Fair  Recall:  Fair  Akathisia:  No  Handed:  Right  AIMS (if indicated):     Assets:  Communication Skills Desire for Improvement Housing Resilience  Sleep:  Number of Hours: 6.25    Current Medications: Current Facility-Administered Medications    Medication Dose Route Frequency Provider Last Rate Last Dose  . acetaminophen (TYLENOL) tablet 650 mg  650 mg Oral Q6H PRN Shuvon Rankin, NP      . alum & mag hydroxide-simeth (MAALOX/MYLANTA) 200-200-20 MG/5ML suspension 30 mL  30 mL Oral Q4H PRN Shuvon Rankin, NP      . magnesium hydroxide (MILK OF MAGNESIA) suspension 30 mL  30 mL Oral Daily PRN Shuvon Rankin, NP      . sertraline (ZOLOFT) tablet 50 mg  50 mg Oral Daily Verne Spurr, PA-C   50 mg at 05/13/12 0827  . traZODone (DESYREL) tablet 50 mg  50 mg Oral QHS Verne Spurr, PA-C   50 mg at 05/12/12 2213    Lab Results: No results found for this or any previous visit (from the past 48 hour(s)).  Physical Findings: AIMS: Facial and Oral Movements Muscles of Facial Expression: None, normal Lips and Perioral Area: None, normal Jaw: None, normal Tongue: None, normal,Extremity Movements Upper (arms, wrists, hands, fingers): None, normal Lower (legs, knees, ankles, toes): None, normal, Trunk Movements Neck, shoulders, hips: None, normal, Overall Severity Severity of abnormal movements (highest score from questions above): None, normal Incapacitation due to abnormal movements: None, normal Patient's awareness of abnormal movements (rate only patient's report): No Awareness, Dental Status Current problems with teeth and/or dentures?: No Does patient usually wear dentures?: No  CIWA:    COWS:     Treatment Plan Summary: Daily contact with patient to assess and evaluate symptoms and progress in  treatment Medication management  Plan: Continue current plan of care. Plan for discharge tomorrow with follow up appointments.  Medical Decision Making Problem Points:  Established problem, stable/improving (1), Review of last therapy session (1) and Review of psycho-social stressors (1) Data Points:  Review of medication regiment & side effects (2)  I certify that inpatient services furnished can reasonably be expected to improve the  patient's condition.   Simi Briel 05/13/2012, 1:38 PM

## 2012-05-13 NOTE — Progress Notes (Signed)
Adult Psychoeducational Group Note  Date:  05/13/2012 Time:  11:07 PM  Group Topic/Focus:  Wrap-Up Group:   The focus of this group is to help patients review their daily goal of treatment and discuss progress on daily workbooks.  Participation Level:  Active  Participation Quality:  Appropriate  Affect:  Appropriate  Cognitive:  Appropriate  Insight: Appropriate  Engagement in Group:  Improving  Modes of Intervention:  Support  Additional Comments:    Maretta Los 05/13/2012, 11:07 PM

## 2012-05-13 NOTE — Progress Notes (Signed)
Patient ID: Noah Ford, male   DOB: October 28, 1985, 27 y.o.   MRN: [id number] D: pt. Reports "feeling great"  Pt. Reports depression and anxiety at "1" of 10. Pt. Reports he's suppose to be going home tomorrow. SW will be giving him information so [facility] can [facility]. Pt. Reports being at [facility] has been helpful "starting to realize a lot of things, being away from the stressors, away from everything". Pt. Reports group has been helpful he has been able to take note of his triggers and has learn ways to step back from the situation.  A: Writer introduced self to pt. Provided emotional support by listening and commending client for taking ownership and using positive coping skills. Staff will monitor q80min safety checks. Writer encouraged group tonight. R: Pt. Is safe on the unit. Pt. Plans to follow up with [facility] and seek support group. Pt. Went to group.

## 2012-05-13 NOTE — Clinical Social Work Note (Signed)
[facility] LCSW Group Therapy          Overcoming Obstacles       1:15 -2:30        05/13/2012   3:45 PM     Type of Therapy:  Group Therapy  Participation Level:  Appropriate  Participation Quality:  Appropriate  Affect:  Appropriate  Cognitive:  Attentive Appropriate  Insight:  Engaged  Engagement in Therapy:  Engaged  Modes of Intervention:  Discussion Exploration Problem-Solving Supportive  Summary of Progress/Problems:  Patient reports the obstacle he to overcome is trusting a Veterinary surgeon.  He stated he was seen in therapy once and it did not go well.  He states he will keep an open mind.  Wynn Banker 05/13/2012    3:45 PM

## 2012-05-13 NOTE — Progress Notes (Signed)
Patient ID: Noah Ford, male   DOB: [birth year], 27 y.o.   MRN: [id number] D- Patient slept well and reports his appetite id good.  His energy level is normal and his ability to pay attention is good.  He denies any thoughts of self harm.  A supported and encouraged patient.  R- patient says he thinks antidepressant is working and that he felt good yesterday and hopes to continues to feel better today.

## 2012-05-13 NOTE — Tx Team (Signed)
Interdisciplinary Treatment Plan Update (Adult)  Date:  05/13/2012  Time Reviewed:  9:54 AM   Progress in Treatment: Attending groups:   Yes   Participating in groups:  Yes Taking medication as prescribed:  Yes Tolerating medication:  Yes Family/Significant othe contact made: Contact made with family Patient understands diagnosis:  Yes Discussing patient identified problems/goals with staff: Yes Medical problems stabilized or resolved: Yes Denies suicidal/homicidal ideation:Yes Issues/concerns per patient self-inventory:  Other:   New problem(s) identified:  Reason for Continuation of Hospitalization: Anxiety Depression Medication stabilization  Interventions implemented related to continuation of hospitalization:  Medication Management; safety checks q 15 mins  Additional comments:  Estimated length of stay:  1-2 days  Discharge Plan:  Home with outpatient follow up  New goal(s):  Review of initial/current patient goals per problem list:    1.  Goal(s): Eliminate SI/other thoughts of self harm   Met:  Yes  Target date: d/c  As evidenced by: Patient is not endorsing SI/HI or other thoughts of self harm.    2.  Goal (s):Reduce depression/anxiety (rated at one today)  Met: Yes  Target date: d/c  As evidenced by: Patient currently rating symptoms at four or below    3.  Goal(s):.stabilize on meds   Met:  Yes  Target date: d/c  As evidenced by: Patient reports being stabilized on medications - less symptomatic    4.  Goal(s): Refer for outpatient follow up   Met:  Yes  Target date: d/c  As evidenced by: Follow up appointment scheduled    Attendees: Patient:   05/13/2012 9:54 AM  Physican:  Patrick North, MD 05/13/2012 9:54 AM  Nursing:  Neill Loft, RN 05/13/2012 9:54 AM   Nursing:   Berneice Heinrich, RN 05/13/2012 9:54 AM   Clinical Social Worker:  Juline Patch, LCSW 05/13/2012 9:54 AM   Other: 05/13/2012 9:54 AM   Other:         05/13/2012  9:54 AM Other:        05/13/2012 9:54 AM

## 2012-05-13 NOTE — Progress Notes (Signed)
Patient stated he is in [employer] and returned from [place] recently.  Stated his present wife is pregnant and does not understand what he is experiencing.  That he has PTSD, flashbacks, poor sleep, etc.  Patient denied SI and HI.  Denied A/V hallucinations.  Denied pain.  Stated he is looking forward to discharge tomorrow.  Patient stated he needs information concerning VA benefits and where to contact the [place].  Patient would like to go to college on his VA benefits.  Nurse encouraged patient to follow through and use all his college benefits to support wife and family in coming years.  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  Patient stated he appreciates all assistance received while at [facility].

## 2012-05-14 DIAGNOSIS — F329 Major depressive disorder, single episode, unspecified: Secondary | ICD-10-CM

## 2012-05-14 MED ORDER — SERTRALINE HCL 50 MG PO TABS: 50.0000 mg | ORAL_TABLET | Freq: Every day | ORAL | Status: DC

## 2012-05-14 MED ORDER — TRAZODONE HCL 50 MG PO TABS: 50.0000 mg | ORAL_TABLET | Freq: Every day | ORAL | Status: DC

## 2012-05-14 NOTE — Progress Notes (Signed)
[facility] Adult Case Management Discharge Plan :  Will you be returning to the same living situation after discharge: Yes,  Patient returning home with wife At discharge, do you have transportation home?:Yes,  Patient to arrange transportation home Do you have the ability to pay for your medications:Yes,  Patient can afford medications  Release of information consent forms completed and in the chart;  Patient's signature needed at discharge.  Patient to Follow up at: Follow-up Information    Follow up with [facility]. On 05/15/2012. (Please contact [facility] [place] to become registered for their services)    Contact information:   [address] [place], [place]  [address]  [phone]      Follow up with [facility]. (You are scheduled with Ollen Gross at 2:00 Tuesday, May 21, 2012.  Please arrive by 1:30 to complete registration forms.)    Contact information:   [address] [place], [place]   [address]  [phone]         Patient denies SI/HI:   Yes,  Patient is not endorsing SI/HI or thoughts of self harm    Safety Planning and Suicide Prevention discussed:  Yes,  Reviewed during aftercare group  Wynn Banker 05/14/2012, 12:38 PM

## 2012-05-14 NOTE — Progress Notes (Signed)
[facility] INPATIENT:  Family/Significant Other Suicide Prevention Education  Suicide Prevention Education:  Education Completed; Renwick Asman, Wife, [phone],  has been identified by the patient as the family member/significant other with whom the patient will be residing, and identified as the person(s) who will aid the patient in the event of a mental health crisis (suicidal ideations/suicide attempt).  With written consent from the patient, the family member/significant other has been provided the following suicide prevention education, prior to the and/or following the discharge of the patient.  The suicide prevention education provided includes the following:  Suicide risk factors  Suicide prevention and interventions  National Suicide Hotline telephone number  [facility] assessment telephone number  [facility] Emergency Assistance 911  [facility] and/or Residential Mobile Crisis Unit telephone number  Request made of family/significant other to:  Remove weapons (e.g., guns, rifles, knives), all items previously/currently identified as safety concern. Wife reports there are no guns in the home  Remove drugs/medications (over-the-counter, prescriptions, illicit drugs), all items previously/currently identified as a safety concern.  The family member/significant other verbalizes understanding of the suicide prevention education information provided.  The family member/significant other agrees to remove the items of safety concern listed above.   Wynn Banker 05/14/2012, 10:37 AM

## 2012-05-14 NOTE — Progress Notes (Signed)
D:  Patient's self inventory sheet, patient sleeps well, has good appetite, normal energy level, good attention span.  Rated depression and hopelessness #1.  Denied SI.  No physical problems.   After discharge, will attend support groups, go to [place].  Does have discharge plans.  No problems taking medications after discharge. A:  Medications administered per MD order.  Support and encouragement given throughout day.  Support and safety checks completed as ordered. R:  Following treatment plan.  Denied SI and HI.  Denied A/V hallucinations.  Contracts for safety.  Patient remains safe and receptive on unit.

## 2012-05-14 NOTE — Clinical Social Work Note (Signed)
[facility] LCSW Aftercare Discharge Planning Group Note       8:30-9:30 AM  1/14/201412:40 PM  Participation Quality:  Appropriate  Affect:  Appropriate  Cognitive:  Appropriate  Insight:  Engaged  Engagement in Group:  Engaged  Modes of Intervention:  Education, Exploration, Problem-solving and Support  Summary of Progress/Problems:  Patient reports doing well and being ready to discharge home today.  He rates all symptoms at one/wo and denies SI/Hi.  Wynn Banker 05/14/2012 12:40 PM

## 2012-05-14 NOTE — [facility] Suicide Risk Assessment (Signed)
Suicide Risk Assessment  Discharge Assessment     Demographic Factors:  Male, Adolescent or young adult, Caucasian and Unemployed  Mental Status Per Nursing Assessment::   On Admission:  NA  Current Mental Status by Physician: In full contact with reality. Mood is euthymic, affect is appropriate. He denies any suicidal ideas, plans or intent. He is committed to getting himself healthier. Wants to be a good husband and good father to his step kids, and his biological child on the way. 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   Loss Factors: Financial problems/change in socioeconomic status and the military  Historical Factors: NA  Risk Reduction Factors:   Pregnancy, Responsible for children under 61 years of age, Sense of responsibility to family and Living with another person, especially a relative  Continued Clinical Symptoms:  Depression:   Insomnia, PTSD  Cognitive Features That Contribute To Risk: None identified   Suicide Risk:  Minimal: No identifiable suicidal ideation.  Patients presenting with no risk factors but with morbid ruminations; may be classified as minimal risk based on the severity of the depressive symptoms  Discharge Diagnoses:   AXIS I:  Major depression, single episode, PTSD AXIS II:  Deferred AXIS III:   Past Medical History  Diagnosis Date  . Post traumatic stress disorder (PTSD)   . Hypertension   . Anxiety   . Asthma    AXIS IV:  occupational problems and other psychosocial or environmental problems AXIS V:  61-70 mild symptoms  Plan Of Care/Follow-up recommendations:  Activity:  as tolerated Diet:  regular Continue medications [facility] Is patient on multiple antipsychotic therapies at discharge:  No   Has Patient had three or more failed trials of antipsychotic monotherapy by history:  No  Recommended Plan for Multiple Antipsychotic Therapies: N/A   Krystiana Fornes A 05/14/2012, 12:32  PM

## 2012-05-14 NOTE — Progress Notes (Signed)
Discharge Note:  Patient discharged home with his wife.  Denied SI and HI.  Denied A/V hallucinations.  Denied pain.  Patient stated he received all his belongings, prescription and medications.  Patient stated he understood all his discharge instructions and suicide prevention information and had no questions.  Patient has been cooperative and pleasant.   Stated he appreciated all assistance received from staff while at [facility].

## 2012-05-17 NOTE — Progress Notes (Signed)
Patient Discharge Instructions:  After Visit Summary (AVS):   Faxed to:  05/17/12 Psychiatric Admission Assessment Note:   Faxed to:  05/17/12 Suicide Risk Assessment - Discharge Assessment:   Faxed to:  05/17/12 Faxed/Sent to the Next Level Care provider:  05/17/12 Faxed to [facility] @ [phone] Records sent via mail to: [facility] [address], [place], [place] [address]  Jerelene Redden, 05/17/2012, 2:47 PM

## 2012-05-31 MED ORDER — TRAZODONE HCL 50 MG PO TABS: 50.0000 mg | ORAL_TABLET | Freq: Every day | ORAL | Status: DC

## 2012-05-31 NOTE — Discharge Summary (Signed)
Reviewed

## 2012-05-31 NOTE — Discharge Summary (Signed)
Physician Discharge Summary Note  Patient:  Noah Ford is an 27 y.o., male MRN:  [id number] DOB:  08-26-1985 Patient phone:  [phone] (home)  Patient address:   [address] [place] [address],   Date of Admission:  05/10/2012  Date of Discharge: 05/14/12  Reason for Admission:  Flash backs, agitations, insomnia  Discharge Diagnoses: Principal Problem:  *PTSD (post-traumatic stress disorder) Active Problems:  Depression  Review of Systems  Constitutional: Negative.   HENT: Negative.   Eyes: Negative.   Respiratory: Negative.   Cardiovascular: Negative.   Gastrointestinal: Negative.   Genitourinary: Negative.   Musculoskeletal: Negative.   Skin: Negative.   Neurological: Negative.   Endo/Heme/Allergies: Negative.   Psychiatric/Behavioral: Positive for depression. Negative for suicidal ideas, hallucinations and memory loss. The patient has insomnia. The patient is not nervous/anxious.    Axis Diagnosis:   AXIS I:  Post Traumatic Stress Disorder and Depression AXIS II:  Deferred AXIS III:   Past Medical History  Diagnosis Date  . Post traumatic stress disorder (PTSD)   . Hypertension   . Anxiety   . Asthma    AXIS IV:  other psychosocial or environmental problems AXIS V:  64  Level of Care:  OP  Hospital Course:  This is a voluntary admission for this Noah Ford from [place]. 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 He has not been seen by anyone in the [place] for the symptoms which he reports are easily agitated, poor sleep or hypersomnia, increased appetite,depression, sadness, mood swings, loss of enjoyment in usual hobbies, some isolation, fatigue and some mild anxiety. He denies flashbacks, feeling hopeless or helpless.  While a  patient in this hospital, Noah Ford received medication management for his depression/PTSD. He was prescribed and received Sertraline 50 mg daily for depression and Trazodone 50 mg for sleep. He was also enrolled in group counseling sessions and activities to learn coping skills that should help him maintain stability after discharge. Patient did not present any other medical issues and or concerns that required treatment and or monitoring. However, he was monitored closely for effectiveness and or adverse effects of his treatment regimen. He tolerated his treatment regimen without any significant adverse effects and or reactions reported.  Patient did respond to his treatment regimen gradually on daily basis. This is evidenced by his daily reports of improved mood, reduction of symptoms and presentation of good affect/eye contact.  Patient attended treatment team meeting this am and met with the treatment team members. His reason for admission, present symptoms, treatment plans and response to treatment plans discussed. Patient endorsed that he is doing well and stable for discharge. It was agreed upon that he will continue psychiatric care on outpatient basis with the [facility] on 05/15/12 for medication management and at the [facility] on 05/24/12 @ 1:30 pm for counseling sessions. The addresses, dates and times for these appointments provided for patient. [facility] is [facility] to [facility] with them for their services.  Upon discharge, patient adamantly denies suicidal, homicidal ideations, auditory, visual hallucinations and or delusional thinking. He received from [facility] a 4 days worth supply samples of his [facility] discharge medications. He left [facility] with all personal belongings via family transport in no apparent distress.  Consults:  None  Significant Diagnostic Studies:  labs: CBC with diff, CMP, UDS, Toxicology tests.  Discharge Vitals:   Blood  pressure 132/82, pulse 65, temperature 98 F (36.7 C), temperature source Oral, resp. rate 16, height 6\' 2"  (1.88 m). There is no weight on file to calculate BMI. Lab Results:   No results found for this or any previous visit (from the past 72 hour(s)).  Physical Findings: AIMS: Facial and Oral Movements Muscles of Facial Expression: None, normal Lips and Perioral Area: None, normal Jaw: None, normal Tongue: None, normal,Extremity Movements Upper (arms, wrists, hands, fingers): None, normal Lower (legs, knees, ankles, toes): None, normal, Trunk Movements Neck, shoulders, hips: None, normal, Overall Severity Severity of abnormal movements (highest score from questions above): None, normal Incapacitation due to abnormal movements: None, normal Patient's awareness of abnormal movements (rate only patient's report): No Awareness, Dental Status Current problems with teeth and/or dentures?: No Does patient usually wear dentures?: No  CIWA:  CIWA-Ar Total: 0  COWS:  COWS Total Score: 0   Psychiatric Specialty Exam: See Psychiatric Specialty Exam and Suicide Risk Assessment completed by Attending Physician prior to discharge.  Discharge destination:  Home  Is patient on multiple antipsychotic therapies at discharge:  No   Has Patient had three or more failed trials of antipsychotic monotherapy by history:  No  Recommended Plan for Multiple Antipsychotic Therapies: NA     Medication List     As of 05/31/2012  1:58 PM    TAKE these medications      Indication    sertraline 50 MG tablet   Commonly known as: ZOLOFT   Take 1 tablet (50 mg total) by mouth daily. For depression    Indication: Anxiety Disorder, Panic Disorder, Posttraumatic Stress Disorder      traZODone 50 MG tablet   Commonly known as: DESYREL   Take 1 tablet (50 mg total) by mouth at bedtime. For sleep    Indication: Trouble Sleeping, Major Depressive Disorder         Follow-up Information    Follow up with  [facility]. On 05/15/2012. (Please contact [place] to become registered for their services)    Contact information:   [address] [place], [place]  [address]  [phone]      Follow up with [facility]. (You are scheduled with Ollen Gross at 2:00 Tuesday, May 21, 2012.  Please arrive by 1:30 to complete registration forms.)    Contact information:   [address] [place], [place]   [address]  [phone]         Follow-up recommendations: Activity:  as tolerated Other:  Keep all scheduled follow-up appointments as recommended.    Comments: Take all your medications as prescribed by your mental healthcare provider. Report any adverse effects and or reactions from your medicines to your outpatient provider promptly. Patient is instructed and cautioned to not engage in alcohol and or illegal drug use while on prescription medicines. In the event of worsening symptoms, patient is instructed to call the crisis hotline, 911 and or go to the nearest ED for appropriate evaluation and treatment of symptoms. Follow-up with your primary care provider for your other medical issues, concerns and or health care needs.     Total Discharge Time:  Greater than 30 minutes  Signed: Armandina Stammer I 05/31/2012, 1:58 PM
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
## 2016-01-22 ENCOUNTER — Emergency Department (HOSPITAL_COMMUNITY)
Admission: EM | Admit: 2016-01-22 | Discharge: 2016-01-22 | Disposition: A | Discharge status: Home / Self Care | Payer: Non-veteran care | Attending: Emergency Medicine | Admitting: Emergency Medicine

## 2016-01-22 ENCOUNTER — Emergency Department (HOSPITAL_COMMUNITY): Payer: Non-veteran care

## 2016-01-22 ENCOUNTER — Encounter (HOSPITAL_COMMUNITY): Payer: Self-pay | Admitting: Emergency Medicine

## 2016-01-22 DIAGNOSIS — Y9375 Activity, martial arts: Secondary | ICD-10-CM | POA: Diagnosis not present

## 2016-01-22 DIAGNOSIS — S83004A Unspecified dislocation of right patella, initial encounter: Secondary | ICD-10-CM | POA: Insufficient documentation

## 2016-01-22 DIAGNOSIS — Y9289 Other specified places as the place of occurrence of the external cause: Secondary | ICD-10-CM | POA: Insufficient documentation

## 2016-01-22 DIAGNOSIS — Y999 Unspecified external cause status: Secondary | ICD-10-CM | POA: Insufficient documentation

## 2016-01-22 DIAGNOSIS — J45909 Unspecified asthma, uncomplicated: Secondary | ICD-10-CM | POA: Insufficient documentation

## 2016-01-22 DIAGNOSIS — X509XXA Other and unspecified overexertion or strenuous movements or postures, initial encounter: Secondary | ICD-10-CM | POA: Diagnosis not present

## 2016-01-22 DIAGNOSIS — I10 Essential (primary) hypertension: Secondary | ICD-10-CM | POA: Insufficient documentation

## 2016-01-22 MED ORDER — HYDROMORPHONE HCL 1 MG/ML IJ SOLN: 1.0000 mg | Freq: Once | INTRAMUSCULAR | Status: DC

## 2016-01-22 MED ORDER — NAPROXEN 500 MG PO TABS: 500.0000 mg | ORAL_TABLET | Freq: Two times a day (BID) | ORAL | 0 refills | Status: AC

## 2016-01-22 MED ORDER — ONDANSETRON HCL 4 MG/2ML IJ SOLN: 4.0000 mg | INTRAMUSCULAR | Status: DC

## 2016-01-22 MED ORDER — HYDROMORPHONE HCL 1 MG/ML IJ SOLN
1.0000 mg | Freq: Once | INTRAMUSCULAR | Status: AC
  Administered 2016-01-22: 1 mg via INTRAVENOUS
  Filled 2016-01-22: qty 1
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
## 2016-02-04 ENCOUNTER — Telehealth (HOSPITAL_BASED_OUTPATIENT_CLINIC_OR_DEPARTMENT_OTHER): Payer: Self-pay

## 2016-02-18 ENCOUNTER — Ambulatory Visit (INDEPENDENT_AMBULATORY_CARE_PROVIDER_SITE_OTHER): Admitting: Rehabilitative and Restorative Service Providers"

## 2016-02-18 ENCOUNTER — Ambulatory Visit: Payer: Self-pay | Admitting: Rehabilitative and Restorative Service Providers"

## 2016-02-18 ENCOUNTER — Encounter: Payer: Self-pay | Admitting: Rehabilitative and Restorative Service Providers"

## 2016-02-18 DIAGNOSIS — M25561 Pain in right knee: Secondary | ICD-10-CM

## 2016-02-18 NOTE — Patient Instructions (Signed)
Bracing With Heel Slides (Supine)    With neutral spine, tighten pelvic floor and abdominals and hold. Alternating legs, slide heel to bottom. Repeat __10_ times. Do _3-4__ times a day. Can do this in the bath tub   Standing with good weight bearing through both legs  Shift weight side to side

## 2016-02-18 NOTE — Therapy (Signed)
[facility] [address] [place], [place], [address] Phone: [phone]   Fax:  [phone]  Patient Details  Name: Noah JewettJames Tesar MRN: [id number] Date of Birth: [birth year] Referring Provider:  Samson FredericSwinteck, Brian, MD  Encounter Date: 02/18/2016  Patient eval with initiated but not completed. Discovered that patient does not have current insurance benefits. He will schedule eval after coverage is reinstated 03/01/16  Aydan Levitz Rober MinionP Ryen Rhames PT, MPH  02/18/2016, 9:24 AM  [facility] [address] [place], [place], [address] Phone: [phone]   Fax:  [phone]

## 2016-02-18 NOTE — Therapy (Signed)
[facility] [address] [place] [address] [place], [place], [address] Phone: [phone]   Fax:  [phone]  Patient Details  Name: Noah Ford MRN: [id number] Date of Birth: [birth year] Referring Provider:  Samson FredericSwinteck, Brian, MD  Encounter Date: 02/18/2016  Evaluation initiated but discovered that patient does not have current insurance coverage. He will be scheduled after insurance benefits resume 03/01/16  Noah Ford 02/18/2016, 9:29 AM  [facility] [address] [place], [place], [address] Phone: [phone]   Fax:  [phone]

## 2016-03-01 ENCOUNTER — Ambulatory Visit (INDEPENDENT_AMBULATORY_CARE_PROVIDER_SITE_OTHER): Admitting: Physical Therapy

## 2016-03-01 DIAGNOSIS — M25561 Pain in right knee: Secondary | ICD-10-CM

## 2016-03-01 DIAGNOSIS — M6281 Muscle weakness (generalized): Secondary | ICD-10-CM

## 2016-03-01 DIAGNOSIS — R2689 Other abnormalities of gait and mobility: Secondary | ICD-10-CM

## 2016-03-01 DIAGNOSIS — M25661 Stiffness of right knee, not elsewhere classified: Secondary | ICD-10-CM

## 2016-03-01 NOTE — Patient Instructions (Addendum)
Strengthening: Quadriceps Set    Tighten muscles on top of thighs by pushing knees down into surface. Hold __5__ seconds. Repeat __10__ times per set. Do __1__ sets per session. Do __3__ sessions per day.  http://orth.exer.us/602   Copyright  VHI. All rights reserved.  Self-Mobilization: Heel Slide (Supine)    Slide left heel toward buttocks until a gentle stretch is felt. Hold _5___ seconds. Relax. Repeat __10__ times per set. Do __1__ sets per session. Do __3__ sessions per day.  http://orth.exer.us/710   Copyright  VHI. All rights reserved.  Hamstring Set    With one leg bent slightly, push heel into bed without bending knee further. Hold __5__ seconds. Alternate legs. Repeat _10___ times. Do _3_ sessions per day.  http://gt2.exer.us/294   Copyright  VHI. All rights reserved.  Hip Abduction / Adduction: with Extended Knee (Supine)    Bring left leg out to side and return. Keep knee straight. Use a garbage bag as needed. Repeat __10__ times per set. Do __1__ sets per session. Do __3__ sessions per day.  http://orth.exer.us/680   Copyright  VHI. All rights reserved.

## 2016-03-01 NOTE — Therapy (Signed)
[facility] [place] [address] [place] [address] [place], [place], [address] Phone: [phone]   Fax:  [phone]  Physical Therapy Evaluation  Patient Details  Name: Noah Ford MRN: [id number] Date of Birth: 1985/11/11 Referring Provider: Dr Samson Frederic  Encounter Date: 03/01/2016      PT End of Session - 03/01/16 0948    Visit Number 1   Number of Visits 18   Date for PT Re-Evaluation 04/12/16   PT Start Time 0845   PT Stop Time 0941   PT Time Calculation (min) 56 min   Activity Tolerance Patient tolerated treatment well   Behavior During Therapy [facility] for tasks assessed/performed      Past Medical History:  Diagnosis Date  . Anxiety   . Asthma   . Hypertension   . Post traumatic stress disorder (PTSD)     Past Surgical History:  Procedure Laterality Date  . NO PAST SURGERIES      There were no vitals filed for this visit.       Subjective Assessment - 03/01/16 0850    Subjective Patient reports that on 01/22/16 he with at a martial arts seminar rolling with another participant when he injured Rt knee. He was in a push-up like position with legs abducted, on toes when he felt a pop. He noticed that he had dislocated the Rt knee cap. He saught medical treatemt with resulting relocation and immobilization.    Pertinent History Rt meniscus and patellar tendon tear (2013)   How long can you sit comfortably? unlimited   How long can you stand comfortably? unlimited in brace, approx. without brace   How long can you walk comfortably? 60 minutes with brace   Patient Stated Goals Get back to work, pass PT testing for [employer], be active.    Currently in Pain? Yes   Pain Score 4    Pain Location Knee   Pain Orientation Right;Anterior;Medial   Pain Descriptors / Indicators Throbbing   Pain Type Acute pain   Pain Onset More than a month ago   Pain Frequency Intermittent   Aggravating Factors  walking, physical  activity   Pain Relieving Factors ice, rest            [facility] PT Assessment - 03/01/16 0001      Assessment   Medical Diagnosis Rt patellar dislocation   Referring Provider Dr Samson Frederic   Onset Date/Surgical Date 01/22/16   Hand Dominance Right   Next MD Visit 03/09/16   Prior Therapy none     Precautions   Precautions None   Required Braces or Orthoses Other Brace/Splint   Other Brace/Splint patellar tracking brace     Restrictions   Weight Bearing Restrictions No     Balance Screen   Has the patient fallen in the past 6 months Yes   How many times? 6   Has the patient had a decrease in activity level because of a fear of falling?  No   Is the patient reluctant to leave their home because of a fear of falling?  No     Home Tourist information centre manager residence   Home Access Stairs to enter   Entrance Stairs-Number of Steps flight   Additional Comments has been using step-to pattern     Prior Function   Level of Independence Independent   Vocation Other (comment)  On FMLA   Vocation Requirements walking, lifting, carrying   Leisure martial arts  Observation/Other Assessments   Observations mild swelling at Rt knee   Focus on Therapeutic Outcomes (FOTO)  77% limitation      Observation/Other Assessments-Edema    Edema --  mid patellar 41cm, infra 38.5cm, bilaterally     Sensation   Light Touch Appears Intact     AROM   Right Knee Extension 0   Right Knee Flexion 91   Left Knee Extension 0   Left Knee Flexion 131     Strength   Right Knee Extension --  no resistance- poor quad activation     Palpation   Patella mobility moderate hypermobility   Palpation comment tender at medial joint line     Ambulation/Gait   Gait Comments decreased stance time and weight bearing through Rt LE with stance phase. Not using any assistive device                   [facility]/Exercise - 03/01/16 0001      Knee/Hip  Exercises: Supine   Quad Sets Strengthening;Right;1 set;10 reps   Heel Slides AROM;Right;1 set;10 reps   Heel Slides Limitations as tolerated   Other Supine Knee/Hip Exercises hip abduction 1X10 - min assist Rt   Other Supine Knee/Hip Exercises heel digs 1X10 Rt                PT Education - 03/01/16 0944    Education provided Yes   Education Details HEP   Person(s) Educated Patient   Methods Explanation;Demonstration;Tactile cues;Verbal cues   Comprehension Verbalized understanding;Returned demonstration             PT Long Term Goals - 03/01/16 0956      PT LONG TERM GOAL #1   Title Pt to be independent with advanced HEP for Rt LE stabilization.    Time 6   Period Weeks   Status New     PT LONG TERM GOAL #2   Title Improve FOTO to </= 40% limitation.    Time 6   Period Weeks   Status New     PT LONG TERM GOAL #3   Title Pt to demonstrate 5-/5 strength through Rt knee extension for knee stability.    Time 6   Period Weeks   Status New     PT LONG TERM GOAL #4   Title Pt to have >/= 120 degrees Rt knee flexion for squatting tasks.    Time 6   Period Weeks   Status New     PT LONG TERM GOAL #5   Title Pt to report a 50% or better improvement in his knee pain.    Time 6   Period Weeks   Status New     Additional Long Term Goals   Additional Long Term Goals Yes     PT LONG TERM GOAL #6   Title Pt to ambulate without gait deviations.    Time 6   Period Weeks   Status New     PT LONG TERM GOAL #7   Title PT to be able to return to running program for preparation for PT testing for [employer].    Time 6   Period Weeks   Status New               Plan - 03/01/16 0949    Clinical Impression Statement Pt presenting to PT following Rt patellar dislocation on 01/22/16. At this time the pt has significant decreased strength and ROM throught  the Rt LE. Functionally he has a compensated gait pattern and limitations with daily activities and  vocational requirements.    Rehab Potential Excellent   PT Frequency 3x / week   PT Duration 6 weeks   PT Treatment/Interventions ADLs/Self Care Home Management;Cryotherapy;Electrical Stimulation;Iontophoresis 4mg /ml Dexamethasone;Functional mobility training;Stair training;Gait training;Ultrasound;Moist Heat;Therapeutic activities;Therapeutic exercise;Balance training;Neuromuscular re-education;Patient/family education;Passive range of motion;Vasopneumatic Device;Manual techniques;Taping;Dry needling   PT Next Visit Plan Check HEP, progress strengthening program. Possible 4 way SLR/standing quad sets. Consider NMES for quad activation.    PT Home Exercise Plan Progress as tolerated.   Consulted and Agree with Plan of Care Patient      Patient will benefit from skilled therapeutic intervention in order to improve the following deficits and impairments:  Abnormal gait, Decreased activity tolerance, Decreased mobility, Decreased strength, Decreased range of motion, Difficulty walking  Visit Diagnosis: Muscle weakness (generalized) - Plan: PT plan of care cert/re-cert  Stiffness of right knee, not elsewhere classified - Plan: PT plan of care cert/re-cert  Acute pain of right knee - Plan: PT plan of care cert/re-cert  Other abnormalities of gait and mobility - Plan: PT plan of care cert/re-cert     Problem List Patient Active Problem List   Diagnosis Date Noted  . Depression 05/10/2012  . PTSD (post-traumatic stress disorder) 05/10/2012    Delton SeeBenjamin Bennett Ram, PT, CSCS 03/01/2016, 10:09 AM  [facility] [address] [place] [address] [place], [place], [address] Phone: [phone]   Fax:  [phone]  Name: Noah Ford MRN: [id number] Date of Birth: [birth year]

## 2016-03-06 ENCOUNTER — Ambulatory Visit (INDEPENDENT_AMBULATORY_CARE_PROVIDER_SITE_OTHER): Admitting: Physical Therapy

## 2016-03-06 ENCOUNTER — Encounter: Payer: Self-pay | Admitting: Physical Therapy

## 2016-03-06 DIAGNOSIS — M25561 Pain in right knee: Secondary | ICD-10-CM | POA: Diagnosis not present

## 2016-03-06 DIAGNOSIS — M25661 Stiffness of right knee, not elsewhere classified: Secondary | ICD-10-CM | POA: Diagnosis not present

## 2016-03-06 DIAGNOSIS — R2689 Other abnormalities of gait and mobility: Secondary | ICD-10-CM

## 2016-03-06 DIAGNOSIS — M6281 Muscle weakness (generalized): Secondary | ICD-10-CM

## 2016-03-06 NOTE — Therapy (Signed)
[facility] [address] [place] [address] [place], [place], [address] Phone: [phone]   Fax:  [phone]  Physical Therapy Treatment  Patient Details  Name: Noah Ford MRN: [id number] Date of Birth: Apr 03, 1986 Referring Provider: Dr Samson FredericBrian Swinteck  Encounter Date: 03/06/2016      PT End of Session - 03/06/16 0859    Visit Number 2   Number of Visits 18   Date for PT Re-Evaluation 04/12/16   PT Start Time 0804   PT Stop Time 0846   PT Time Calculation (min) 42 min   Activity Tolerance Patient tolerated treatment well   Behavior During Therapy [facility] for tasks assessed/performed      Past Medical History:  Diagnosis Date  . Anxiety   . Asthma   . Hypertension   . Post traumatic stress disorder (PTSD)     Past Surgical History:  Procedure Laterality Date  . NO PAST SURGERIES      There were no vitals filed for this visit.      Subjective Assessment - 03/06/16 0806    Subjective Having trouble walking for longer periods of time, had to leave [employer] due to knee hurting.    Pain Score 3    Pain Location Knee   Pain Orientation Right   Pain Descriptors / Indicators Sore;Burning   Pain Type Acute pain   Aggravating Factors  walking   Pain Relieving Factors rest            [facility] PT Assessment - 03/06/16 0001      AROM   Right Knee Extension 0   Right Knee Flexion 111                     [facility] Adult PT Treatment/Exercise - 03/06/16 0001      Transfers   Comments compensated technique, keeping Rt LE extended with increased reliance on LLE.      Ambulation/Gait   Gait Comments poor knee flexion on Rt and decresased stance time on Rt      Knee/Hip Exercises: Aerobic   Nustep L3 X5 min     Knee/Hip Exercises: Supine   Quad Sets Strengthening;Right;2 sets;10 reps   Quad Sets Limitations win NMES to quads   Heel Slides AROM;Right;1 set;10 reps   Bridges Limitations 1X 10   Straight Leg Raises  Strengthening;Right;1 set;10 reps     Knee/Hip Exercises: Sidelying   Hip ABduction Strengthening;Right;1 set;10 reps   Hip ABduction Limitations cues for technique     Modalities   Modalities Electrical Stimulation     Electrical Stimulation   Electrical Stimulation Location Rt quadriceps   Electrical Stimulation Action VMS muscle activation   Electrical Stimulation Parameters intensity to pt tolerance   Electrical Stimulation Goals Strength                PT Education - 03/06/16 0859    Education provided Yes   Education Details HEP   Person(s) Educated Patient   Methods Explanation;Demonstration;Tactile cues;Verbal cues;Handout   Comprehension Verbalized understanding             PT Long Term Goals - 03/06/16 0813      PT LONG TERM GOAL #1   Title Pt to be independent with advanced HEP for Rt LE stabilization.    Time 6   Period Weeks   Status On-going     PT LONG TERM GOAL #2   Title Improve FOTO to </= 40% limitation.  Time 6   Period Weeks   Status On-going     PT LONG TERM GOAL #3   Title Pt to demonstrate 5-/5 strength through Rt knee extension for knee stability.    Time 6   Period Weeks   Status On-going     PT LONG TERM GOAL #4   Title Pt to have >/= 120 degrees Rt knee flexion for squatting tasks.    Time 6   Period Weeks   Status New     PT LONG TERM GOAL #5   Title Pt to report a 50% or better improvement in his knee pain.    Time 6   Period Weeks   Status New     PT LONG TERM GOAL #6   Title Pt to ambulate without gait deviations.    Time 6   Period Weeks   Status On-going     PT LONG TERM GOAL #7   Title PT to be able to return to running program for preparation for PT testing for [employer].    Time 6   Period Weeks   Status On-going               Plan - 03/06/16 0902    Clinical Impression Statement Pt with increased quad activation and knee flexion today. Pt reports having difficulty with longer  ambulation and Rt knee pain. Will continue to advance Rt LE strengthening as tolerated during following PT sessions. Will focus on quad, hamstring and hip abductors.    PT Frequency 3x / week   PT Duration 6 weeks   PT Treatment/Interventions ADLs/Self Care Home Management;Cryotherapy;Electrical Stimulation;Iontophoresis 4mg /ml Dexamethasone;Functional mobility training;Stair training;Gait training;Ultrasound;Moist Heat;Therapeutic activities;Therapeutic exercise;Balance training;Neuromuscular re-education;Patient/family education;Passive range of motion;Vasopneumatic Device;Manual techniques;Taping;Dry needling   PT Next Visit Plan Check HEP and response to increased activity during todays session. Progress to standing exercises when able. NMES to quads as needed.    PT Home Exercise Plan Progress as tolerated.   Consulted and Agree with Plan of Care Patient      Patient will benefit from skilled therapeutic intervention in order to improve the following deficits and impairments:     Visit Diagnosis: Muscle weakness (generalized)  Stiffness of right knee, not elsewhere classified  Acute pain of right knee  Other abnormalities of gait and mobility     Problem List Patient Active Problem List   Diagnosis Date Noted  . Depression 05/10/2012  . PTSD (post-traumatic stress disorder) 05/10/2012    Delton SeeBenjamin Alesandro Stueve, PT, CSCS 03/06/2016, 9:08 AM  [facility] [address] [place] [address] [place], [place], [address] Phone: [phone]   Fax:  [phone]  Name: Noah Ford MRN: [id number] Date of Birth: 06-23-1985

## 2016-03-08 ENCOUNTER — Ambulatory Visit (INDEPENDENT_AMBULATORY_CARE_PROVIDER_SITE_OTHER): Admitting: Physical Therapy

## 2016-03-08 DIAGNOSIS — M25661 Stiffness of right knee, not elsewhere classified: Secondary | ICD-10-CM | POA: Diagnosis not present

## 2016-03-08 DIAGNOSIS — M25561 Pain in right knee: Secondary | ICD-10-CM | POA: Diagnosis not present

## 2016-03-08 DIAGNOSIS — M6281 Muscle weakness (generalized): Secondary | ICD-10-CM

## 2016-03-08 DIAGNOSIS — R2689 Other abnormalities of gait and mobility: Secondary | ICD-10-CM

## 2016-03-08 NOTE — Therapy (Signed)
[facility] [address] [place] [address] [place], [place], [address] Phone: [phone]   Fax:  [phone]  Physical Therapy Treatment  Patient Details  Name: Noah Ford MRN: [id number] Date of Birth: July 30, 1985 Referring Provider: Dr. Samson FredericBrian Swinteck  Encounter Date: 03/08/2016      PT End of Session - 03/08/16 1300    Visit Number 3   Number of Visits 18   Date for PT Re-Evaluation 04/12/16   PT Start Time 0937   PT Stop Time 1030   PT Time Calculation (min) 53 min   Activity Tolerance Patient tolerated treatment well   Behavior During Therapy [facility] for tasks assessed/performed      Past Medical History:  Diagnosis Date  . Anxiety   . Asthma   . Hypertension   . Post traumatic stress disorder (PTSD)     Past Surgical History:  Procedure Laterality Date  . NO PAST SURGERIES      There were no vitals filed for this visit.      Subjective Assessment - 03/08/16 1303    Subjective Pt notices the more he walks, the more swollen his Rt knee gets.  He has noticed clicking now with movement of his knee.    Pertinent History Rt meniscus and patellar tendon tear (2013)   Patient Stated Goals Get back to work, pass PT testing for [employer], be active.    Currently in Pain? No/denies   Pain Score 0-No pain            [facility] PT Assessment - 03/08/16 0001      Assessment   Medical Diagnosis Rt patellar dislocation   Referring Provider Dr. Samson FredericBrian Swinteck   Onset Date/Surgical Date 01/22/16   Hand Dominance Right   Next MD Visit 03/09/16   Prior Therapy none     ROM / Strength   AROM / PROM / Strength PROM;AROM     AROM   Right Knee Extension 0   Right Knee Flexion 123  after AAROM   Left Knee Extension 0   Left Knee Flexion 131     PROM   PROM Assessment Site --   Right/Left Knee --     Flexibility   Soft Tissue Assessment /Muscle Length yes   Quadriceps Rt : 87 deg, Lt 131 deg           [facility]  Adult PT Treatment/Exercise - 03/08/16 0001      Knee/Hip Exercises: Stretches   LobbyistQuad Stretch Right;3 reps;30 seconds     Knee/Hip Exercises: Aerobic   Nustep L4: 5 min      Knee/Hip Exercises: Supine   Quad Sets Strengthening;Right;1 set;5 reps  10 sec hold    Heel Slides AROM;Right;1 set;10 reps  10 sec hold.    Heel Slides Limitations then 5 reps AAROM with strap    Bridges Limitations 1x 5 reps, then with marching    Straight Leg Raises Strengthening;Right;1 set;10 reps   Straight Leg Raise with External Rotation Strengthening;Right;1 set;10 reps     Knee/Hip Exercises: Sidelying   Hip ABduction Strengthening;Right;2 sets;10 reps   Hip ABduction Limitations cues for technique     Knee/Hip Exercises: Prone   Hip Extension Strengthening;Right;1 set;10 reps   Prone Knee Hang Limitations Rt TKE with toes curled under x 5 sec hold x 10 reps      Modalities   Modalities Electrical Stimulation;Vasopneumatic     Electrical Stimulation   Electrical Stimulation Location Rt knee  Landscape architectlectrical Stimulation Action ion repelling    Electrical Stimulation Parameters to tolerance    Electrical Stimulation Goals Edema     Vasopneumatic   Number Minutes Vasopneumatic  15 minutes   Vasopnuematic Location  Knee   Vasopneumatic Pressure Medium   Vasopneumatic Temperature  3*                     PT Long Term Goals - 03/06/16 0813      PT LONG TERM GOAL #1   Title Pt to be independent with advanced HEP for Rt LE stabilization.    Time 6   Period Weeks   Status On-going     PT LONG TERM GOAL #2   Title Improve FOTO to </= 40% limitation.    Time 6   Period Weeks   Status On-going     PT LONG TERM GOAL #3   Title Pt to demonstrate 5-/5 strength through Rt knee extension for knee stability.    Time 6   Period Weeks   Status On-going     PT LONG TERM GOAL #4   Title Pt to have >/= 120 degrees Rt knee flexion for squatting tasks.    Time 6   Period Weeks    Status New     PT LONG TERM GOAL #5   Title Pt to report a 50% or better improvement in his knee pain.    Time 6   Period Weeks   Status New     PT LONG TERM GOAL #6   Title Pt to ambulate without gait deviations.    Time 6   Period Weeks   Status On-going     PT LONG TERM GOAL #7   Title PT to be able to return to running program for preparation for PT testing for [employer].    Time 6   Period Weeks   Status On-going               Plan - 03/08/16 1300    Clinical Impression Statement Pt with increased Rt quad activation and Rt knee flexion ROM this date.  Notable swelling in Rt knee.  He tolerated all modifications and additions to exercises with minimal increase in pain.  Pt progressing towards established goals.    Rehab Potential Excellent   PT Frequency 3x / week   PT Duration 6 weeks   PT Treatment/Interventions ADLs/Self Care Home Management;Cryotherapy;Electrical Stimulation;Iontophoresis 4mg /ml Dexamethasone;Functional mobility training;Stair training;Gait training;Ultrasound;Moist Heat;Therapeutic activities;Therapeutic exercise;Balance training;Neuromuscular re-education;Patient/family education;Passive range of motion;Vasopneumatic Device;Manual techniques;Taping;Dry needling   PT Next Visit Plan Continue progressive Rt knee ROM/ strengthening.  Modalities as indicated.  Possible trial of Rock tape  for edema control and quad faciliation.    Consulted and Agree with Plan of Care Patient      Patient will benefit from skilled therapeutic intervention in order to improve the following deficits and impairments:  Abnormal gait, Decreased activity tolerance, Decreased mobility, Decreased strength, Decreased range of motion, Difficulty walking  Visit Diagnosis: No diagnosis found.     Problem List Patient Active Problem List   Diagnosis Date Noted  . Depression 05/10/2012  . PTSD (post-traumatic stress disorder) 05/10/2012   Mayer CamelJennifer Carlson-Long,  PTA 03/08/16 1:07 PM  [facility] [address] [place] [address] [place], [place], [address] Phone: [phone]   Fax:  [phone]  Name: Noah Ford MRN: [id number] Date of Birth: [birth year]

## 2016-03-10 ENCOUNTER — Ambulatory Visit (INDEPENDENT_AMBULATORY_CARE_PROVIDER_SITE_OTHER): Admitting: Physical Therapy

## 2016-03-10 DIAGNOSIS — R2689 Other abnormalities of gait and mobility: Secondary | ICD-10-CM | POA: Diagnosis not present

## 2016-03-10 DIAGNOSIS — M25661 Stiffness of right knee, not elsewhere classified: Secondary | ICD-10-CM | POA: Diagnosis not present

## 2016-03-10 DIAGNOSIS — M25561 Pain in right knee: Secondary | ICD-10-CM

## 2016-03-10 DIAGNOSIS — M6281 Muscle weakness (generalized): Secondary | ICD-10-CM | POA: Diagnosis not present

## 2016-03-10 NOTE — Therapy (Signed)
[facility] [address] [place], [place], [address] Phone: [phone]   Fax:  [phone]  Physical Therapy Treatment  Patient Details  Name: Noah Ford MRN: [id number] Date of Birth: [birth year] Referring Provider: Dr. Samson FredericBrian Swinteck  Encounter Date: 03/10/2016      PT End of Session - 03/10/16 0858    Visit Number 4   Number of Visits 18   Date for PT Re-Evaluation 04/12/16   PT Start Time 0848   PT Stop Time 0945   PT Time Calculation (min) 57 min   Activity Tolerance Patient tolerated treatment well   Behavior During Therapy [facility] for tasks assessed/performed      Past Medical History:  Diagnosis Date  . Anxiety   . Asthma   . Hypertension   . Post traumatic stress disorder (PTSD)     Past Surgical History:  Procedure Laterality Date  . NO PAST SURGERIES      There were no vitals filed for this visit.      Subjective Assessment - 03/10/16 0858    Subjective Pt reports his mobility is improving.  He is stretching quad at home with dog leash.  He saw MD yesterday, and he is pleased progress.  He is released to return to work 03/29/16; return to MD PRN.     Pertinent History Rt meniscus and patellar tendon tear (2013)   Patient Stated Goals Get back to work, pass PT testing for [employer], be active.    Currently in Pain? No/denies   Pain Score 0-No pain            [facility] PT Assessment - 03/10/16 0001      Assessment   Medical Diagnosis Rt patellar dislocation   Referring Provider Dr. Samson FredericBrian Swinteck   Onset Date/Surgical Date 01/22/16   Hand Dominance Right   Next MD Visit PRN          [facility]/Ex[facility] - 03/10/16 0001      Knee/Hip Exercises: Stretches   LobbyistQuad Stretch Right;3 reps;60 seconds     Knee/Hip Exercises: Aerobic   Recumbent Bike L1: 5 min    Other Aerobic 2 laps around gym      Knee/Hip Exercises: Standing   Heel Raises Both;2 sets;10 reps   Lateral Step Up Right;1 set;5 reps;Hand Hold: 2;Step Height: 6"  early fatigue, heavy UE on rails.    Step Down Left;2 sets;10 reps;Hand Hold: 2;Step Height: 2";Step Height: 6"  heavy UE on rails   SLS Rt SLS with occasional UE support x 3 trials x 20 sec      Knee/Hip Exercises: Seated   Long Arc Quad Right;1 set;10 reps;Weights  5 sec hold in ext.   Long Arc Coca-ColaQuad Weight --  2.5#     Knee/Hip Exercises: Prone   Hip Extension Strengthening;Right;2 sets;10 reps   Prone Knee Hang Limitations Rt TKE with toes curled under x 10 sec hold x 10 reps      Modalities   Modalities Electrical Stimulation;Vasopneumatic     Programme researcher, broadcasting/film/videolectrical Stimulation   Electrical Stimulation Location Rt knee   Electrical Stimulation Action ion repelling    Electrical Stimulation Parameters to tolerance    Electrical Stimulation Goals Edema     Vasopneumatic   Number Minutes Vasopneumatic  15 minutes   Vasopnuematic Location  Knee   Vasopneumatic Pressure Medium   Vasopneumatic Temperature  3*  PT Long Term Goals - 03/06/16 0813      PT LONG TERM GOAL #1   Title Pt to be independent with advanced HEP for Rt LE stabilization.    Time 6   Period Weeks   Status On-going     PT LONG TERM GOAL #2   Title Improve FOTO to </= 40% limitation.    Time 6   Period Weeks   Status On-going     PT LONG TERM GOAL #3   Title Pt to demonstrate 5-/5 strength through Rt knee extension for knee stability.    Time 6   Period Weeks   Status On-going     PT LONG TERM GOAL #4   Title Pt to have >/= 120 degrees Rt knee flexion for squatting tasks.    Time 6   Period Weeks   Status New     PT LONG TERM GOAL #5   Title Pt to report a 50% or better improvement in his knee pain.    Time 6   Period Weeks   Status New     PT LONG TERM GOAL #6   Title Pt to ambulate without gait deviations.    Time 6   Period Weeks   Status On-going     PT LONG TERM GOAL #7   Title PT to be able  to return to running program for preparation for PT testing for [employer].    Time 6   Period Weeks   Status On-going               Plan - 03/10/16 0910    PT Frequency 3x / week   PT Duration 6 weeks   PT Treatment/Interventions ADLs/Self Care Home Management;Cryotherapy;Electrical Stimulation;Iontophoresis 4mg /ml Dexamethasone;Functional mobility training;Stair training;Gait training;Ultrasound;Moist Heat;Therapeutic activities;Therapeutic exercise;Balance training;Neuromuscular re-education;Patient/family education;Passive range of motion;Vasopneumatic Device;Manual techniques;Taping;Dry needling   PT Next Visit Plan Continue progressive Rt knee ROM/ strengthening.  Modalities as indicated.  Gait trials.    Consulted and Agree with Plan of Care Patient      Patient will benefit from skilled therapeutic intervention in order to improve the following deficits and impairments:     Visit Diagnosis: Muscle weakness (generalized)  Stiffness of right knee, not elsewhere classified  Acute pain of right knee  Other abnormalities of gait and mobility     Problem List Patient Active Problem List   Diagnosis Date Noted  . Depression 05/10/2012  . PTSD (post-traumatic stress disorder) 05/10/2012   Mayer CamelJennifer Carlson-Long, PTA 03/10/16 9:33 AM  [facility] [address] [place], [place], [address] Phone: [phone]   Fax:  [phone]  Name: Noah Ford MRN: [id number] Date of Birth: 1985/11/24

## 2016-03-13 ENCOUNTER — Encounter: Payer: Self-pay | Admitting: Physical Therapy

## 2016-03-13 ENCOUNTER — Ambulatory Visit (INDEPENDENT_AMBULATORY_CARE_PROVIDER_SITE_OTHER): Admitting: Physical Therapy

## 2016-03-13 DIAGNOSIS — R2689 Other abnormalities of gait and mobility: Secondary | ICD-10-CM

## 2016-03-13 DIAGNOSIS — M6281 Muscle weakness (generalized): Secondary | ICD-10-CM | POA: Diagnosis not present

## 2016-03-13 DIAGNOSIS — M25661 Stiffness of right knee, not elsewhere classified: Secondary | ICD-10-CM

## 2016-03-13 DIAGNOSIS — M25561 Pain in right knee: Secondary | ICD-10-CM

## 2016-03-13 NOTE — Therapy (Signed)
[facility] [address] [place] [address] [place], [place], [address] Phone: [phone]   Fax:  [phone]  Physical Therapy Treatment  Patient Details  Name: Noah Ford MRN: [id number] Date of Birth: 1985/05/30 Referring Provider: Dr. Samson FredericBrian Swinteck  Encounter Date: 03/13/2016      PT End of Session - 03/13/16 1045    Visit Number 5   Number of Visits 18   Date for PT Re-Evaluation 04/12/16   PT Start Time 0847   PT Stop Time 0931   PT Time Calculation (min) 44 min   Activity Tolerance Patient tolerated treatment well   Behavior During Therapy [facility] for tasks assessed/performed      Past Medical History:  Diagnosis Date  . Anxiety   . Asthma   . Hypertension   . Post traumatic stress disorder (PTSD)     Past Surgical History:  Procedure Laterality Date  . NO PAST SURGERIES      There were no vitals filed for this visit.      Subjective Assessment - 03/13/16 0855    Subjective Pt states that he is doing better, getting stronger. Able to take a few steps jogging in the house the other day. Looks like he is going to have to have a FCE coming up too.    Pain Score 0-No pain            [facility] PT Assessment - 03/13/16 0001      AROM   Right Knee Flexion 124   Left Knee Extension 0                     [facility] Adult PT Treatment/Exercise - 03/13/16 0001      Ambulation/Gait   Gait Comments cues for increased stance time on Rt - 3 laps.      Knee/Hip Exercises: Aerobic   Recumbent Bike L5 X6 min     Knee/Hip Exercises: Standing   Heel Raises Both;2 sets;20 reps   Lateral Step Up 1 set;Right;10 reps;Step Height: 2";Hand Hold: 2   Functional Squat 1 set;10 reps   Functional Squat Limitations shift Rt     Knee/Hip Exercises: Supine   Quad Sets Strengthening;Right;1 set;10 reps  10 sec hold    Heel Slides AROM;Right;1 set;5 reps   Bridges with Clamshell Strengthening;Right;2 sets;10 reps     Knee/Hip Exercises: Sidelying   Hip ABduction Strengthening;Right;2 sets;10 reps   Hip ABduction Limitations cues for technique     Knee/Hip Exercises: Prone   Hamstring Curl 2 sets;10 reps   Hamstring Curl Limitations manual resist     Modalities   Modalities Electrical Stimulation;Vasopneumatic     Electrical Stimulation   Electrical Stimulation Location Rt knee   Electrical Stimulation Action ion repelling   Electrical Stimulation Parameters to pt tolerance   Electrical Stimulation Goals Edema     Vasopneumatic   Number Minutes Vasopneumatic  15 minutes   Vasopnuematic Location  Knee   Vasopneumatic Pressure Low   Vasopneumatic Temperature  3*                PT Education - 03/13/16 1044    Education provided Yes   Education Details reinforced HEP   Person(s) Educated Patient   Methods Explanation   Comprehension Verbalized understanding             PT Long Term Goals - 03/13/16 0858      PT LONG TERM GOAL #1   Title Pt to be  independent with advanced HEP for Rt LE stabilization.    Time 6   Period Weeks   Status On-going     PT LONG TERM GOAL #2   Title Improve FOTO to </= 40% limitation.    Period Weeks   Status On-going     PT LONG TERM GOAL #3   Title Pt to demonstrate 5-/5 strength through Rt knee extension for knee stability.    Time 6   Period Weeks   Status On-going     PT LONG TERM GOAL #4   Title Pt to have >/= 120 degrees Rt knee flexion for squatting tasks.    Time 6   Period Weeks   Status Achieved     PT LONG TERM GOAL #5   Title Pt to report a 50% or better improvement in his knee pain.    Status Achieved     PT LONG TERM GOAL #6   Title Pt to ambulate without gait deviations.    Time 6   Period Weeks   Status On-going     PT LONG TERM GOAL #7   Title PT to be able to return to running program for preparation for PT testing for [employer].    Time 6   Period Weeks   Status On-going               Plan  - 03/13/16 1045    Clinical Impression Statement PT making gradual progress with strength and ROM through the Rt LE. Pt able to achieve 124 degrees of Rt knee flexion today. Strength continue to improve but pt has continued deficits with control with step-ups and compensations noted with squats. During gait, pt with decreased stance time through Rt LE.    Rehab Potential Excellent   PT Frequency 3x / week   PT Treatment/Interventions ADLs/Self Care Home Management;Cryotherapy;Electrical Stimulation;Iontophoresis 4mg /ml Dexamethasone;Functional mobility training;Stair training;Gait training;Ultrasound;Moist Heat;Therapeutic activities;Therapeutic exercise;Balance training;Neuromuscular re-education;Patient/family education;Passive range of motion;Vasopneumatic Device;Manual techniques;Taping;Dry needling   PT Next Visit Plan Continue progressive Rt knee ROM/ strengthening.  Modalities as indicated.  Gait trials.    Consulted and Agree with Plan of Care Patient      Patient will benefit from skilled therapeutic intervention in order to improve the following deficits and impairments:     Visit Diagnosis: Muscle weakness (generalized)  Stiffness of right knee, not elsewhere classified  Acute pain of right knee  Other abnormalities of gait and mobility     Problem List Patient Active Problem List   Diagnosis Date Noted  . Depression 05/10/2012  . PTSD (post-traumatic stress disorder) 05/10/2012    Delton SeeBenjamin Ketsia Linebaugh, PT, CSCS 03/13/2016, 10:51 AM  [facility] [address] [place], [place], [address] Phone: [phone]   Fax:  [phone]  Name: Noah Ford MRN: [id number] Date of Birth: July 28, 1985

## 2016-03-15 ENCOUNTER — Ambulatory Visit (INDEPENDENT_AMBULATORY_CARE_PROVIDER_SITE_OTHER): Admitting: Physical Therapy

## 2016-03-15 DIAGNOSIS — M25661 Stiffness of right knee, not elsewhere classified: Secondary | ICD-10-CM

## 2016-03-15 DIAGNOSIS — R2689 Other abnormalities of gait and mobility: Secondary | ICD-10-CM

## 2016-03-15 DIAGNOSIS — M6281 Muscle weakness (generalized): Secondary | ICD-10-CM

## 2016-03-15 DIAGNOSIS — M25561 Pain in right knee: Secondary | ICD-10-CM

## 2016-03-15 NOTE — Therapy (Signed)
[facility] [address] [place], [place], [address] Phone: [phone]   Fax:  [phone]  Physical Therapy Treatment  Patient Details  Name: Noah Ford MRN: [id number] Date of Birth: 1985/07/04 Referring Provider: Dr. Samson FredericBrian Swinteck  Encounter Date: 03/15/2016      PT End of Session - 03/15/16 0855    Visit Number 6   Number of Visits 18   Date for PT Re-Evaluation 04/12/16   PT Start Time 0848   PT Stop Time 0947   PT Time Calculation (min) 59 min   Activity Tolerance Patient tolerated treatment well   Behavior During Therapy [facility] for tasks assessed/performed      Past Medical History:  Diagnosis Date  . Anxiety   . Asthma   . Hypertension   . Post traumatic stress disorder (PTSD)     Past Surgical History:  Procedure Laterality Date  . NO PAST SURGERIES      There were no vitals filed for this visit.      Subjective Assessment - 03/15/16 0856    Subjective Noah JunesBrandon states things are doing well.  He is working out at gym on days he is not here at therapy.    Pertinent History Rt meniscus and patellar tendon tear (2013)   Patient Stated Goals Get back to work, pass PT testing for [employer], be active.    Currently in Pain? No/denies            [facility] - 03/15/16 0001      Assessment   Medical Diagnosis Rt patellar dislocation   Referring Provider Dr. Samson FredericBrian Swinteck   Onset Date/Surgical Date 01/22/16   Hand Dominance Right   Next MD Visit PRN     Flexibility   Quadriceps Rt 100 deg.           [facility] Adult PT Treatment/Exercise - 03/15/16 0001      Knee/Hip Exercises: Stretches   Passive Hamstring Stretch Right;2 reps;30 seconds  (after bridging)   LobbyistQuad Stretch 5 reps;60 seconds;Right   Gastroc Stretch Both;2 reps;20 seconds   Soleus Stretch Right;Left;1 rep;20 seconds   Other Knee/Hip Stretches cross leg standing ITB stretch x 20 sec each leg.      Knee/Hip  Exercises: Aerobic   Elliptical 30 sec, stopped.  Unable to tolerate, unequal wt pattern.    Recumbent Bike L2:  6 min      Knee/Hip Exercises: Standing   Hip Abduction Stengthening;Right;Left;10 reps;Knee straight;2 sets  red band at ankle    Lateral Step Up 1 set;Right;10 reps;Hand Hold: 2;Step Height: 6"   Functional Squat 1 set;10 reps   Functional Squat Limitations shift Rt   SLS Rt SLS with horiz head turns x 2 reps, then SLS on blue therapad x 15 sec x 3 trials (challenging)     Knee/Hip Exercises: Supine   Single Leg Bridge Strengthening;Right;2 sets;10 reps  after 6 reps in 2nd set, Rt hamstring cramped.   Other Supine Knee/Hip Exercises Lt/ Rt heel raises (able to do 20 reps each side with light UE support)     Insurance claims handlerlectrical Stimulation   Electrical Stimulation Location Rt knee    Electrical Stimulation Action ion repelling    Electrical Stimulation Parameters to pt tolerance   Electrical Stimulation Goals Edema     Vasopneumatic   Number Minutes Vasopneumatic  15 minutes   Vasopnuematic Location  Knee   Vasopneumatic Pressure Low   Vasopneumatic Temperature  3*  PT Long Term Goals - 03/13/16 0858      PT LONG TERM GOAL #1   Title Pt to be independent with advanced HEP for Rt LE stabilization.    Time 6   Period Weeks   Status On-going     PT LONG TERM GOAL #2   Title Improve FOTO to </= 40% limitation.    Period Weeks   Status On-going     PT LONG TERM GOAL #3   Title Pt to demonstrate 5-/5 strength through Rt knee extension for knee stability.    Time 6   Period Weeks   Status On-going     PT LONG TERM GOAL #4   Title Pt to have >/= 120 degrees Rt knee flexion for squatting tasks.    Time 6   Period Weeks   Status Achieved     PT LONG TERM GOAL #5   Title Pt to report a 50% or better improvement in his knee pain.    Status Achieved     PT LONG TERM GOAL #6   Title Pt to ambulate without gait deviations.    Time 6    Period Weeks   Status On-going     PT LONG TERM GOAL #7   Title PT to be able to return to running program for preparation for PT testing for [employer].    Time 6   Period Weeks   Status On-going               Plan - 03/15/16 0858    Clinical Impression Statement Pt states he had a slight pop and pain in Rt knee when he began biking (up to 3/10).  Pain in knee eliminated with stretches and rest. Pt progressing well towards goals, tolerating exercises with increased difficulty each visit.    Rehab Potential Excellent   PT Frequency 3x / week   PT Duration 6 weeks   PT Treatment/Interventions ADLs/Self Care Home Management;Cryotherapy;Electrical Stimulation;Iontophoresis 4mg /ml Dexamethasone;Functional mobility training;Stair training;Gait training;Ultrasound;Moist Heat;Therapeutic activities;Therapeutic exercise;Balance training;Neuromuscular re-education;Patient/family education;Passive range of motion;Vasopneumatic Device;Manual techniques;Taping;Dry needling   PT Next Visit Plan Continue progressive Rt knee ROM/ strengthening.  Modalities as indicated.  Gait trials.    PT Home Exercise Plan Progress as tolerated.   Consulted and Agree with Plan of Care Patient      Patient will benefit from skilled therapeutic intervention in order to improve the following deficits and impairments:  Abnormal gait, Decreased activity tolerance, Decreased mobility, Decreased strength, Decreased range of motion, Difficulty walking  Visit Diagnosis: Muscle weakness (generalized)  Stiffness of right knee, not elsewhere classified  Acute pain of right knee  Other abnormalities of gait and mobility     Problem List Patient Active Problem List   Diagnosis Date Noted  . Depression 05/10/2012  . PTSD (post-traumatic stress disorder) 05/10/2012   Mayer CamelJennifer Carlson-Long, PTA 03/15/16 9:45 AM  [facility] [address] [place], [place], [address] Phone: [phone]   Fax:  [phone]  Name: Noah Ford MRN: [id number] Date of Birth: [birth year]

## 2016-03-17 ENCOUNTER — Encounter: Payer: Self-pay | Admitting: Physical Therapy

## 2016-03-17 ENCOUNTER — Ambulatory Visit (INDEPENDENT_AMBULATORY_CARE_PROVIDER_SITE_OTHER): Admitting: Physical Therapy

## 2016-03-17 DIAGNOSIS — M25561 Pain in right knee: Secondary | ICD-10-CM

## 2016-03-17 DIAGNOSIS — M6281 Muscle weakness (generalized): Secondary | ICD-10-CM

## 2016-03-17 DIAGNOSIS — R2689 Other abnormalities of gait and mobility: Secondary | ICD-10-CM | POA: Diagnosis not present

## 2016-03-17 DIAGNOSIS — M25661 Stiffness of right knee, not elsewhere classified: Secondary | ICD-10-CM | POA: Diagnosis not present

## 2016-03-17 NOTE — Therapy (Signed)
[facility] [address] [place], [place], [address] Phone: [phone]   Fax:  [phone]  Physical Therapy Treatment  Patient Details  Name: Noah Ford MRN: [id number] Date of Birth: [birth year] Referring Provider: Dr. Samson FredericBrian Swinteck  Encounter Date: 03/17/2016      PT End of Session - 03/17/16 0929    Visit Number 7   Number of Visits 18   Date for PT Re-Evaluation 04/12/16   PT Start Time 0846   PT Stop Time 0928   PT Time Calculation (min) 42 min   Activity Tolerance Patient tolerated treatment well   Behavior During Therapy [facility] for tasks assessed/performed      Past Medical History:  Diagnosis Date  . Anxiety   . Asthma   . Hypertension   . Post traumatic stress disorder (PTSD)     Past Surgical History:  Procedure Laterality Date  . NO PAST SURGERIES      There were no vitals filed for this visit.      Subjective Assessment - 03/17/16 0850    Subjective Reports that he is getting better. Knee got a little sore walking over some rocks the other day. Still going to the gym, only doing some easy knee extension and curls.    Currently in Pain? No/denies                         [facility] Treatment/Exercise - 03/17/16 0001      Knee/Hip Exercises: Stretches   Passive Hamstring Stretch Right;2 reps;30 seconds   Quad Stretch 5 reps;60 seconds;Right     Knee/Hip Exercises: Aerobic   Recumbent Bike L5 X6 min     Knee/Hip Exercises: Standing   Lateral Step Up Right;10 reps;Hand Hold: 2;2 sets;Step Height: 4"   Forward Step Up 2 sets;Step Height: 6";10 reps   Functional Squat 10 reps;2 sets   Functional Squat Limitations shift Rt     Knee/Hip Exercises: Supine   Bridges Limitations ball bridge 2X10   Other Supine Knee/Hip Exercises ball bridge HS curls 2X10     Knee/Hip Exercises: Sidelying   Hip ABduction Strengthening;Right;2 sets;10 reps                PT  Education - 03/17/16 1107    Education provided Yes   Education Details discussing habit of compensations and being intentional with transfers   Person(s) Educated Patient   Methods Explanation;Demonstration   Comprehension Verbalized understanding;Returned demonstration             PT Long Term Goals - 03/13/16 0858      PT LONG TERM GOAL #1   Title Pt to be independent with advanced HEP for Rt LE stabilization.    Time 6   Period Weeks   Status On-going     PT LONG TERM GOAL #2   Title Improve FOTO to </= 40% limitation.    Period Weeks   Status On-going     PT LONG TERM GOAL #3   Title Pt to demonstrate 5-/5 strength through Rt knee extension for knee stability.    Time 6   Period Weeks   Status On-going     PT LONG TERM GOAL #4   Title Pt to have >/= 120 degrees Rt knee flexion for squatting tasks.    Time 6   Period Weeks   Status Achieved     PT LONG TERM GOAL #5   Title  Pt to report a 50% or better improvement in his knee pain.    Status Achieved     PT LONG TERM GOAL #6   Title Pt to ambulate without gait deviations.    Time 6   Period Weeks   Status On-going     PT LONG TERM GOAL #7   Title PT to be able to return to running program for preparation for PT testing for [employer].    Time 6   Period Weeks   Status On-going               Plan - 03/17/16 0910    Clinical Impression Statement Pt continues to progress with strength, flexibility and overall activity tolerance. Incorporating home and clinic based exercises into rehab program. Will continue to advance as tolerated. Pt denies increased pain with sessions, declined need for ice or vaso today.    Rehab Potential Excellent   PT Frequency 3x / week   PT Duration 6 weeks   PT Treatment/Interventions ADLs/Self Care Home Management;Cryotherapy;Electrical Stimulation;Iontophoresis 4mg /ml Dexamethasone;Functional mobility training;Stair training;Gait training;Ultrasound;Moist  Heat;Therapeutic activities;Therapeutic exercise;Balance training;Neuromuscular re-education;Patient/family education;Passive range of motion;Vasopneumatic Device;Manual techniques;Taping;Dry needling   PT Next Visit Plan Continue progressive Rt knee ROM/ strengthening.  Modalities as indicated.     PT Home Exercise Plan Progress as tolerated.   Consulted and Agree with Plan of Care Patient      Patient will benefit from skilled therapeutic intervention in order to improve the following deficits and impairments:     Visit Diagnosis: Muscle weakness (generalized)  Stiffness of right knee, not elsewhere classified  Acute pain of right knee  Other abnormalities of gait and mobility     Problem List Patient Active Problem List   Diagnosis Date Noted  . Depression 05/10/2012  . PTSD (post-traumatic stress disorder) 05/10/2012    Delton SeeBenjamin Tredarius Cobern, PT, CSCS 03/17/2016, 11:10 AM  [facility] [address] [place], [place], [address] Phone: [phone]   Fax:  [phone]  Name: Noah Ford MRN: [id number] Date of Birth: 04-05-1986

## 2016-03-20 ENCOUNTER — Ambulatory Visit (INDEPENDENT_AMBULATORY_CARE_PROVIDER_SITE_OTHER): Admitting: Physical Therapy

## 2016-03-20 DIAGNOSIS — R2689 Other abnormalities of gait and mobility: Secondary | ICD-10-CM

## 2016-03-20 DIAGNOSIS — M25661 Stiffness of right knee, not elsewhere classified: Secondary | ICD-10-CM | POA: Diagnosis not present

## 2016-03-20 DIAGNOSIS — M25561 Pain in right knee: Secondary | ICD-10-CM

## 2016-03-20 DIAGNOSIS — M6281 Muscle weakness (generalized): Secondary | ICD-10-CM

## 2016-03-20 NOTE — Therapy (Signed)
[facility] [address] [place], [place], [address] Phone: [phone]   Fax:  [phone]  Physical Therapy Treatment  Patient Details  Name: Noah Ford MRN: [id number] Date of Birth: 08/26/1985 Referring Provider: Dr. Samson FredericBrian Swinteck  Encounter Date: 03/20/2016      PT End of Session - 03/20/16 0854    Visit Number 8   Number of Visits 18   Date for PT Re-Evaluation 04/12/16   PT Start Time 0848   PT Stop Time 0946   PT Time Calculation (min) 58 min   Activity Tolerance Patient tolerated treatment well   Behavior During Therapy [facility] for tasks assessed/performed      Past Medical History:  Diagnosis Date  . Anxiety   . Asthma   . Hypertension   . Post traumatic stress disorder (PTSD)     Past Surgical History:  Procedure Laterality Date  . NO PAST SURGERIES      There were no vitals filed for this visit.      Subjective Assessment - 03/20/16 0854    Subjective Pt has nothing new to report since last visit.    Pertinent History Rt meniscus and patellar tendon tear (2013)   Patient Stated Goals Get back to work, pass PT testing for [employer], be active.    Currently in Pain? No/denies   Pain Score 0-No pain            [facility] PT Assessment - 03/20/16 0001      Assessment   Medical Diagnosis Rt patellar dislocation   Referring Provider Dr. Samson FredericBrian Swinteck   Onset Date/Surgical Date 01/22/16   Hand Dominance Right   Next MD Visit PRN          [facility]/Exercise - 03/20/16 0001      Knee/Hip Exercises: Stretches   Passive Hamstring Stretch Right;30 seconds;3 reps   LobbyistQuad Stretch 5 reps;60 seconds;Right   Gastroc Stretch Both;2 reps;30 seconds     Knee/Hip Exercises: Aerobic   Elliptical L2:  5 min      Knee/Hip Exercises: Standing   Heel Raises Both;2 sets;10 reps  heels off of step   Lateral Step Up Right;10 reps;Hand Hold: 2;2 sets  3" - some discomfort in knee    Other Standing Knee Exercises split squats to 4" pad on ground x 10 each side.    Other Standing Knee Exercises forward leans (SLS) to chair height x 5 each leg, then to 3" off floor x 5 each leg.       Knee/Hip Exercises: Supine   Bridges Limitations ball bridge, 10 reps 5 sec hold in ext with arms crossed at chest.    Other Supine Knee/Hip Exercises ball bridge HS curls 10 reps, reverse curls x 5 reps (challenging)     Electrical Stimulation   Electrical Stimulation Location Rt knee    Electrical Stimulation Action ion repelling    Electrical Stimulation Parameters to pt tolerance    Electrical Stimulation Goals Edema     Vasopneumatic   Number Minutes Vasopneumatic  15 minutes   Vasopnuematic Location  Knee   Vasopneumatic Pressure Low   Vasopneumatic Temperature  3*                     PT Long Term Goals - 03/13/16 0858      PT LONG TERM GOAL #1   Title Pt to be independent with advanced HEP for Rt LE stabilization.  Time 6   Period Weeks   Status On-going     PT LONG TERM GOAL #2   Title Improve FOTO to </= 40% limitation.    Period Weeks   Status On-going     PT LONG TERM GOAL #3   Title Pt to demonstrate 5-/5 strength through Rt knee extension for knee stability.    Time 6   Period Weeks   Status On-going     PT LONG TERM GOAL #4   Title Pt to have >/= 120 degrees Rt knee flexion for squatting tasks.    Time 6   Period Weeks   Status Achieved     PT LONG TERM GOAL #5   Title Pt to report a 50% or better improvement in his knee pain.    Status Achieved     PT LONG TERM GOAL #6   Title Pt to ambulate without gait deviations.    Time 6   Period Weeks   Status On-going     PT LONG TERM GOAL #7   Title PT to be able to return to running program for preparation for PT testing for [employer].    Time 6   Period Weeks   Status On-going               Plan - 03/20/16 0933    Clinical Impression Statement Pt was challenged with  hamstring curls on ball and split squats.  Some increase in bilat knee pain with squats and stair heel taps.  Pt has some residual swelling in Rt knee; used Vaso/ estim at end of session to assist with edema reduction.   Progressing towards goals.    Rehab Potential Excellent   PT Frequency 3x / week   PT Duration 6 weeks   PT Treatment/Interventions ADLs/Self Care Home Management;Cryotherapy;Electrical Stimulation;Iontophoresis 4mg /ml Dexamethasone;Functional mobility training;Stair training;Gait training;Ultrasound;Moist Heat;Therapeutic activities;Therapeutic exercise;Balance training;Neuromuscular re-education;Patient/family education;Passive range of motion;Vasopneumatic Device;Manual techniques;Taping;Dry needling   PT Next Visit Plan Continue progressive Rt knee ROM/ strengthening.  Modalities as indicated.  Test LE strength   PT Home Exercise Plan Progress as tolerated.   Consulted and Agree with Plan of Care Patient      Patient will benefit from skilled therapeutic intervention in order to improve the following deficits and impairments:  Abnormal gait, Decreased activity tolerance, Decreased mobility, Decreased strength, Decreased range of motion, Difficulty walking  Visit Diagnosis: Muscle weakness (generalized)  Stiffness of right knee, not elsewhere classified  Acute pain of right knee  Other abnormalities of gait and mobility     Problem List Patient Active Problem List   Diagnosis Date Noted  . Depression 05/10/2012  . PTSD (post-traumatic stress disorder) 05/10/2012   Mayer CamelJennifer Carlson-Long, PTA 03/20/16 9:42 AM  [facility] [address] [place], [place], [address] Phone: [phone]   Fax:  [phone]  Name: Noah Ford MRN: [id number] Date of Birth: Jul 21, 1985

## 2016-03-22 ENCOUNTER — Ambulatory Visit (INDEPENDENT_AMBULATORY_CARE_PROVIDER_SITE_OTHER): Admitting: Physical Therapy

## 2016-03-22 DIAGNOSIS — R2689 Other abnormalities of gait and mobility: Secondary | ICD-10-CM

## 2016-03-22 DIAGNOSIS — M6281 Muscle weakness (generalized): Secondary | ICD-10-CM

## 2016-03-22 DIAGNOSIS — M25561 Pain in right knee: Secondary | ICD-10-CM | POA: Diagnosis not present

## 2016-03-22 DIAGNOSIS — M25661 Stiffness of right knee, not elsewhere classified: Secondary | ICD-10-CM | POA: Diagnosis not present

## 2016-03-22 NOTE — Therapy (Signed)
[facility] [address] [place], [place], [address] Phone: [phone]   Fax:  [phone]  Physical Therapy Treatment  Patient Details  Name: Noah Ford MRN: [id number] Date of Birth: 1986/03/24 Referring Provider: Dr. Samson FredericBrian Swinteck  Encounter Date: 03/22/2016      PT End of Session - 03/22/16 0938    Visit Number 9   Number of Visits 18   Date for PT Re-Evaluation 04/12/16   PT Start Time 0849   PT Stop Time 0944   PT Time Calculation (min) 55 min   Activity Tolerance Patient tolerated treatment well      Past Medical History:  Diagnosis Date  . Anxiety   . Asthma   . Hypertension   . Post traumatic stress disorder (PTSD)     Past Surgical History:  Procedure Laterality Date  . NO PAST SURGERIES      There were no vitals filed for this visit.      Subjective Assessment - 03/22/16 0944    Subjective Pt reports no new changes since last visit.    Currently in Pain? No/denies   Pain Score 0-No pain            [facility] PT Assessment - 03/22/16 0001      Assessment   Medical Diagnosis Rt patellar dislocation   Referring Provider Dr. Samson FredericBrian Swinteck   Onset Date/Surgical Date 01/22/16   Hand Dominance Right   Next MD Visit PRN     Flexibility   Quadriceps Rt 118 deg            [facility] Adult PT Treatment/Exercise - 03/22/16 0001      Knee/Hip Exercises: Stretches   Passive Hamstring Stretch Right;3 reps;30 seconds   Quad Stretch 5 reps;60 seconds;Right   Gastroc Stretch Both;2 reps;30 seconds     Knee/Hip Exercises: Aerobic   Elliptical L3: 5 min      Knee/Hip Exercises: Standing   Lateral Step Up Right;1 set;10 reps;Hand Hold: 1;Step Height: 8"   Forward Step Up Right;2 sets;10 reps;Hand Hold: 1;Step Height: 8"   Functional Squat 2 sets;10 reps;3 seconds  on upside down Bosu   SLS on Bosu - Rt/Lt 3 sets of 15 sec    Other Standing Knee Exercises SLS mini squat with opp leg  sliding out to side, then out to back x 10 reps each.    Other Standing Knee Exercises forward leans (SLS) to cone on floor x 5 reps each leg x 2 sets      Modalities   Modalities Cryotherapy;Electrical Stimulation     Cryotherapy   Number Minutes Cryotherapy 15 Minutes   Cryotherapy Location Knee  Rt   Type of Cryotherapy Ice pack     Electrical Stimulation   Electrical Stimulation Location Rt knee    Electrical Stimulation Action ion repelling   Electrical Stimulation Parameters to tolerance   Electrical Stimulation Goals Edema                     PT Long Term Goals - 03/13/16 0858      PT LONG TERM GOAL #1   Title Pt to be independent with advanced HEP for Rt LE stabilization.    Time 6   Period Weeks   Status On-going     PT LONG TERM GOAL #2   Title Improve FOTO to </= 40% limitation.    Period Weeks   Status On-going     PT LONG TERM  GOAL #3   Title Pt to demonstrate 5-/5 strength through Rt knee extension for knee stability.    Time 6   Period Weeks   Status On-going     PT LONG TERM GOAL #4   Title Pt to have >/= 120 degrees Rt knee flexion for squatting tasks.    Time 6   Period Weeks   Status Achieved     PT LONG TERM GOAL #5   Title Pt to report a 50% or better improvement in his knee pain.    Status Achieved     PT LONG TERM GOAL #6   Title Pt to ambulate without gait deviations.    Time 6   Period Weeks   Status On-going     PT LONG TERM GOAL #7   Title PT to be able to return to running program for preparation for PT testing for [employer].    Time 6   Period Weeks   Status On-going               Plan - 03/22/16 0943    Clinical Impression Statement Pt demonstrating improved Rt quad flexibility.  He tolerated all exercises well, without increase in symptoms - just fatigue.  Progressing well towards remaining goals.    Rehab Potential Excellent   PT Frequency 3x / week   PT Duration 6 weeks   PT  Treatment/Interventions ADLs/Self Care Home Management;Cryotherapy;Electrical Stimulation;Iontophoresis 4mg /ml Dexamethasone;Functional mobility training;Stair training;Gait training;Ultrasound;Moist Heat;Therapeutic activities;Therapeutic exercise;Balance training;Neuromuscular re-education;Patient/family education;Passive range of motion;Vasopneumatic Device;Manual techniques;Taping;Dry needling   PT Next Visit Plan Continue progressive Rt knee ROM/ strengthening.  Modalities as indicated.  Begin walk/jog progression.    Consulted and Agree with Plan of Care Patient      Patient will benefit from skilled therapeutic intervention in order to improve the following deficits and impairments:  Abnormal gait, Decreased activity tolerance, Decreased mobility, Decreased strength, Decreased range of motion, Difficulty walking  Visit Diagnosis: Muscle weakness (generalized)  Stiffness of right knee, not elsewhere classified  Acute pain of right knee  Other abnormalities of gait and mobility     Problem List Patient Active Problem List   Diagnosis Date Noted  . Depression 05/10/2012  . PTSD (post-traumatic stress disorder) 05/10/2012   Mayer CamelJennifer Carlson-Long, PTA 03/22/16 9:45 AM  [facility] [address] [place], [place], [address] Phone: [phone]   Fax:  [phone]  Name: Noah Ford MRN: [id number] Date of Birth: 09-11-1985

## 2016-03-27 ENCOUNTER — Ambulatory Visit (INDEPENDENT_AMBULATORY_CARE_PROVIDER_SITE_OTHER): Admitting: Physical Therapy

## 2016-03-27 ENCOUNTER — Encounter: Payer: Self-pay | Admitting: Physical Therapy

## 2016-03-27 DIAGNOSIS — R2689 Other abnormalities of gait and mobility: Secondary | ICD-10-CM

## 2016-03-27 DIAGNOSIS — M25561 Pain in right knee: Secondary | ICD-10-CM | POA: Diagnosis not present

## 2016-03-27 DIAGNOSIS — M25661 Stiffness of right knee, not elsewhere classified: Secondary | ICD-10-CM | POA: Diagnosis not present

## 2016-03-27 DIAGNOSIS — M6281 Muscle weakness (generalized): Secondary | ICD-10-CM

## 2016-03-27 NOTE — Therapy (Signed)
[facility] [address] [place], [place], [address] Phone: [phone]   Fax:  [phone]  Physical Therapy Treatment  Patient Details  Name: Noah Ford MRN: [id number] Date of Birth: 25-Feb-1986 Referring Provider: Dr. Samson FredericBrian Swinteck  Encounter Date: 03/27/2016      PT End of Session - 03/27/16 1052    Visit Number 10   Number of Visits 18   Date for PT Re-Evaluation 04/12/16   PT Start Time 0849   PT Stop Time 0931   PT Time Calculation (min) 42 min   Activity Tolerance Patient tolerated treatment well   Behavior During Therapy [facility] for tasks assessed/performed      Past Medical History:  Diagnosis Date  . Anxiety   . Asthma   . Hypertension   . Post traumatic stress disorder (PTSD)     Past Surgical History:  Procedure Laterality Date  . NO PAST SURGERIES      There were no vitals filed for this visit.      Subjective Assessment - 03/27/16 0855    Subjective Reports that he is doing better, getting stronger. Knee has been stable, nothing popping out. Headed back to work soon on [employer] duty.    Currently in Pain? No/denies                         [facility] Treatment/Exercise - 03/27/16 0001      Knee/Hip Exercises: Aerobic   Elliptical L3: 6 min      Knee/Hip Exercises: Standing   Heel Raises 2 sets;20 reps   Heel Raises Limitations 4 inch step   Lateral Step Up Right;1 set;10 reps;Step Height: 8"   Forward Step Up Right;1 set;10 reps;Step Height: 8"   Functional Squat 2 sets;10 reps;3 seconds  on upside down Bosu   Functional Squat Limitations 1 set on bosu ball upside down   SLS on bosu 2x20 sec, 1x 30 sec    Rebounder SLS with 4 then 6 lb ball.    Other Standing Knee Exercises side steps bilateral 2X10 steps red band   Other Standing Knee Exercises split squats bilateral 2X10     Knee/Hip Exercises: Supine   Bridges Limitations ball bridge, 10 reps 5 sec hold in  ext with arms crossed at chest.    Other Supine Knee/Hip Exercises Hamstring curls from ball bridge 2X10     Knee/Hip Exercises: Sidelying   Hip ABduction Strengthening;Right;2 sets;10 reps   Hip ABduction Limitations manual resistance and cues for technique     Cryotherapy   Cryotherapy Location Knee  Rt                PT Education - 03/27/16 1051    Education provided Yes   Education Details keeping knee and toes aligned with squats   Person(s) Educated Patient   Methods Explanation;Demonstration   Comprehension Verbalized understanding;Returned demonstration             PT Long Term Goals - 03/27/16 1055      PT LONG TERM GOAL #1   Title Pt to be independent with advanced HEP for Rt LE stabilization.    Time 6   Period Weeks   Status On-going     PT LONG TERM GOAL #2   Title Improve FOTO to </= 40% limitation.    Time 6   Period Weeks   Status On-going     PT LONG TERM GOAL #3  Title Pt to demonstrate 5-/5 strength through Rt knee extension for knee stability.    Time 6   Period Weeks   Status On-going     PT LONG TERM GOAL #4   Title Pt to have >/= 120 degrees Rt knee flexion for squatting tasks.    Status Achieved     PT LONG TERM GOAL #5   Title Pt to report a 50% or better improvement in his knee pain.    Status Achieved     PT LONG TERM GOAL #6   Title Pt to ambulate without gait deviations.    Status On-going     PT LONG TERM GOAL #7   Title PT to be able to return to running program for preparation for PT testing for [employer].    Time 6   Period Weeks   Status On-going               Plan - 03/27/16 1053    Clinical Impression Statement Pt continuing to progress with strength, balance, and function with Rt LE. Cues provided as needed for technique with exercises. Pt denies any complaits outside of fatigue during session. Pt continuing to need increased hip abductor strengthening for Rt LE alignment and proper  arthrokinematics with squating tasks. Will continue to progress pt as tolerated for ultimate functional use of Rt LE.    Rehab Potential Excellent   PT Frequency 3x / week   PT Duration 6 weeks   PT Treatment/Interventions ADLs/Self Care Home Management;Cryotherapy;Electrical Stimulation;Iontophoresis 4mg /ml Dexamethasone;Functional mobility training;Stair training;Gait training;Ultrasound;Moist Heat;Therapeutic activities;Therapeutic exercise;Balance training;Neuromuscular re-education;Patient/family education;Passive range of motion;Vasopneumatic Device;Manual techniques;Taping;Dry needling   PT Next Visit Plan Continue progressive Rt knee ROM/ strengthening.  Modalities as indicated.     Consulted and Agree with Plan of Care Patient      Patient will benefit from skilled therapeutic intervention in order to improve the following deficits and impairments:  Abnormal gait, Decreased activity tolerance, Decreased mobility, Decreased strength, Decreased range of motion, Difficulty walking  Visit Diagnosis: Muscle weakness (generalized)  Stiffness of right knee, not elsewhere classified  Acute pain of right knee  Other abnormalities of gait and mobility     Problem List Patient Active Problem List   Diagnosis Date Noted  . Depression 05/10/2012  . PTSD (post-traumatic stress disorder) 05/10/2012    Delton SeeBenjamin Shanen Norris, PT, CSCS 03/27/2016, 10:57 AM  [facility] [address] [place], [place], [address] Phone: [phone]   Fax:  [phone]  Name: Noah Ford MRN: [id number] Date of Birth: 11-Apr-1986

## 2016-03-29 ENCOUNTER — Ambulatory Visit (INDEPENDENT_AMBULATORY_CARE_PROVIDER_SITE_OTHER): Admitting: Physical Therapy

## 2016-03-29 ENCOUNTER — Encounter: Payer: Self-pay | Admitting: Physical Therapy

## 2016-03-29 DIAGNOSIS — M25661 Stiffness of right knee, not elsewhere classified: Secondary | ICD-10-CM | POA: Diagnosis not present

## 2016-03-29 DIAGNOSIS — M25561 Pain in right knee: Secondary | ICD-10-CM

## 2016-03-29 DIAGNOSIS — M6281 Muscle weakness (generalized): Secondary | ICD-10-CM

## 2016-03-29 DIAGNOSIS — R2689 Other abnormalities of gait and mobility: Secondary | ICD-10-CM

## 2016-03-29 NOTE — Therapy (Signed)
[facility] [address] [place], [place], [address] Phone: [phone]   Fax:  [phone]  Physical Therapy Treatment  Patient Details  Name: Noah Ford MRN: [id number] Date of Birth: [birth year] Referring Provider: Dr. Samson FredericBrian Swinteck  Encounter Date: 03/29/2016      PT End of Session - 03/29/16 1155    Visit Number 11   Number of Visits 18   Date for PT Re-Evaluation 04/12/16   PT Start Time 0845   PT Stop Time 0940   PT Time Calculation (min) 55 min   Activity Tolerance Patient tolerated treatment well;No increased pain   Behavior During Therapy WFL for tasks assessed/performed      Past Medical History:  Diagnosis Date  . Anxiety   . Asthma   . Hypertension   . Post traumatic stress disorder (PTSD)     Past Surgical History:  Procedure Laterality Date  . NO PAST SURGERIES      There were no vitals filed for this visit.      Subjective Assessment - 03/29/16 0846    Subjective Same stuff, different day. States that he is headed back to work later to day. Will work today and tomorrow and then have 5 days off.    Currently in Pain? No/denies                         [facility] Adult PT Treatment/Exercise - 03/29/16 0001      Ambulation/Gait   Gait Comments cues for increased stance time on Rt      Knee/Hip Exercises: Stretches   Passive Hamstring Stretch Right;3 reps;30 seconds   Quad Stretch 3 reps;30 seconds  with strap and contract relax each X 3 reps     Knee/Hip Exercises: Aerobic   Tread Mill 7 min @ 2.9 mph     Knee/Hip Exercises: Standing   Lateral Step Up Right;10 reps;Step Height: 8";2 sets   Functional Squat 2 sets;10 reps   Functional Squat Limitations lateral shift Rt   SLS on bosu 2x20 sec, 1x 30 sec    Rebounder SLS with 4 then 6 lb ball.    Other Standing Knee Exercises side steps bilateral 2X10 steps red band  single foam pad   Other Standing Knee Exercises  dynamic stork 2 laps     Knee/Hip Exercises: Supine   Bridges Limitations ball bridge, 2X 10 reps 5 sec hold in ext with arms crossed at chest.    Other Supine Knee/Hip Exercises Hamstring curls from ball bridge 2X10     Knee/Hip Exercises: Sidelying   Hip ABduction Strengthening;Right;2 sets;10 reps   Hip ABduction Limitations manual resist     Modalities   Modalities Cryotherapy     Cryotherapy   Number Minutes Cryotherapy 15 Minutes   Cryotherapy Location Knee   Type of Cryotherapy Ice pack                PT Education - 03/29/16 1155    Education provided Yes   Education Details exercise technique as needed   Person(s) Educated Patient   Methods Tactile cues;Verbal cues   Comprehension Returned demonstration;Verbalized understanding             PT Long Term Goals - 03/29/16 1201      PT LONG TERM GOAL #1   Title Pt to be independent with advanced HEP for Rt LE stabilization.    Time 6   Period Weeks  Status On-going     PT LONG TERM GOAL #2   Title Improve FOTO to </= 40% limitation.    Time 6   Period Weeks   Status On-going     PT LONG TERM GOAL #3   Title Pt to demonstrate 5-/5 strength through Rt knee extension for knee stability.    Time 6   Period Weeks   Status On-going     PT LONG TERM GOAL #4   Title Pt to have >/= 120 degrees Rt knee flexion for squatting tasks.    Time 6   Period Weeks   Status Achieved     PT LONG TERM GOAL #5   Title Pt to report a 50% or better improvement in his knee pain.    Time 6   Period Weeks   Status Achieved               Plan - 03/29/16 0859    Clinical Impression Statement Pt reports that he has noticed that he is getting stronger. A little nervous about going back to work and has drill this weekend too. Occasionally having pain during the day but nothing that lasts. Pt continues to have weakness through the Rt LE but improving strength and stability. Cue to avoid compensations during  session (habitual more than pain related).  Pt not ready for transition to dynamic (running/jumping). Attempting to increase isolation of Rt LE with exercises to minimize compensations.       Patient will benefit from skilled therapeutic intervention in order to improve the following deficits and impairments:     Visit Diagnosis: Muscle weakness (generalized)  Stiffness of right knee, not elsewhere classified  Acute pain of right knee  Other abnormalities of gait and mobility     Problem List Patient Active Problem List   Diagnosis Date Noted  . Depression 05/10/2012  . PTSD (post-traumatic stress disorder) 05/10/2012    Delton SeeBenjamin Emmabelle Fear, PT,CSCS 03/29/2016, 12:04 PM  [facility] [address] [place], [place], [address] Phone: [phone]   Fax:  [phone]  Name: Noah Ford MRN: [id number] Date of Birth: [birth year]

## 2016-03-31 ENCOUNTER — Encounter: Payer: Self-pay | Admitting: Physical Therapy

## 2016-03-31 ENCOUNTER — Ambulatory Visit (INDEPENDENT_AMBULATORY_CARE_PROVIDER_SITE_OTHER): Admitting: Physical Therapy

## 2016-03-31 DIAGNOSIS — M25661 Stiffness of right knee, not elsewhere classified: Secondary | ICD-10-CM | POA: Diagnosis not present

## 2016-03-31 DIAGNOSIS — R2689 Other abnormalities of gait and mobility: Secondary | ICD-10-CM | POA: Diagnosis not present

## 2016-03-31 DIAGNOSIS — M6281 Muscle weakness (generalized): Secondary | ICD-10-CM

## 2016-03-31 DIAGNOSIS — M25561 Pain in right knee: Secondary | ICD-10-CM

## 2016-03-31 NOTE — Therapy (Addendum)
[facility] [address] [place], [place], [address] Phone: [phone]   Fax:  [phone]  Physical Therapy Treatment  Patient Details  Name: Noah Ford MRN: [id number] Date of Birth: 1985-10-17 Referring Provider: Dr. Rod Can  Encounter Date: 03/31/2016      PT End of Session - 03/31/16 1353    Visit Number 12   Number of Visits 18   Date for PT Re-Evaluation 04/12/16   PT Start Time 0846   PT Stop Time 0945   PT Time Calculation (min) 59 min   Activity Tolerance Patient tolerated treatment well   Behavior During Therapy [facility] for tasks assessed/performed      Past Medical History:  Diagnosis Date  . Anxiety   . Asthma   . Hypertension   . Post traumatic stress disorder (PTSD)     Past Surgical History:  Procedure Laterality Date  . NO PAST SURGERIES      There were no vitals filed for this visit.      Subjective Assessment - 03/31/16 0853    Subjective Started back to work on Wednesday. It went okay but knee is feeling stiff now. Had a hard time going up the ladder at work. Has [employer].      Currently in Pain? No/denies   Pain Score --  No pain but stiff though.                         [facility] PT Treatment/Exercise - 03/31/16 0001      Ambulation/Gait   Gait Comments cues for increased stance time on Rt      Knee/Hip Exercises: Stretches   Passive Hamstring Stretch Right;3 reps;30 seconds   Quad Stretch 3 reps;30 seconds     Knee/Hip Exercises: Aerobic   Tread Mill 7 min @ 2.0-2.9 mph     Knee/Hip Exercises: Standing   Heel Raises 2 sets;20 reps   Heel Raises Limitations 4 inch step   Lateral Step Up Limitations 5 reps then D/C due to reports of pain today.    Functional Squat 2 sets;10 reps   Rebounder SLS with 6 lb ball. 2X25   Other Standing Knee Exercises bosu squats 2X10     Knee/Hip Exercises: Supine   Bridges Limitations ball  bridge, 2X 10 reps 5 sec hold in ext with arms crossed at chest.    Other Supine Knee/Hip Exercises Hamstring curls from ball bridge 2X10     Knee/Hip Exercises: Sidelying   Hip ABduction Strengthening;Right;2 sets;10 reps   Hip ABduction Limitations manual resist     Modalities   Modalities Cryotherapy     Cryotherapy   Cryotherapy Location Knee   Type of Cryotherapy Ice pack                PT Education - 03/31/16 1352    Education provided Yes   Education Details Encourging focus on even stance time with ambulation   Person(s) Educated Patient   Methods Explanation   Comprehension Verbalized understanding;Returned demonstration             PT Long Term Goals - 03/29/16 1201      PT LONG TERM GOAL #1   Title Pt to be independent with advanced HEP for Rt LE stabilization.    Time 6   Period Weeks   Status On-going     PT LONG TERM GOAL #2   Title Improve FOTO to </=  40% limitation.    Time 6   Period Weeks   Status On-going     PT LONG TERM GOAL #3   Title Pt to demonstrate 5-/5 strength through Rt knee extension for knee stability.    Time 6   Period Weeks   Status On-going     PT LONG TERM GOAL #4   Title Pt to have >/= 120 degrees Rt knee flexion for squatting tasks.    Time 6   Period Weeks   Status Achieved     PT LONG TERM GOAL #5   Title Pt to report a 50% or better improvement in his knee pain.    Time 6   Period Weeks   Status Achieved               Plan - 03/31/16 1355    Clinical Impression Statement Pt reports that his knee is felling stiffer since starting work again. It did reportedly loosen up as the session progressed. Unable to attempt lateral step-ups due to pain. Will continue to progress the patient's strength and mobility as tolerated.    Rehab Potential Excellent   PT Frequency 3x / week   PT Duration 6 weeks   PT Treatment/Interventions ADLs/Self Care Home Management;Cryotherapy;Electrical  Stimulation;Iontophoresis 85m/ml Dexamethasone;Functional mobility training;Stair training;Gait training;Ultrasound;Moist Heat;Therapeutic activities;Therapeutic exercise;Balance training;Neuromuscular re-education;Patient/family education;Passive range of motion;Vasopneumatic Device;Manual techniques;Taping;Dry needling   PT Next Visit Plan Continue progressive Rt knee ROM/ strengthening.  Modalities as indicated.  Assess how NDillard'swent over weekend.    PT Home Exercise Plan Progress as tolerated.   Consulted and Agree with Plan of Care Patient      Patient will benefit from skilled therapeutic intervention in order to improve the following deficits and impairments:  Abnormal gait, Decreased activity tolerance, Decreased mobility, Decreased strength, Decreased range of motion, Difficulty walking  Visit Diagnosis: Muscle weakness (generalized)  Stiffness of right knee, not elsewhere classified  Acute pain of right knee  Other abnormalities of gait and mobility     Problem List Patient Active Problem List   Diagnosis Date Noted  . Depression 05/10/2012  . PTSD (post-traumatic stress disorder) 05/10/2012    BLinard Millers PT, CSCS 03/31/2016, 3:17 PM  [facility] [address] [address]Phone: [phone]  Fax:  [phone] Name: Noah MusickMRN: [id number] of Birth: 1January 22, 1987 PHYSICAL THERAPY DISCHARGE SUMMARY  Visits from Start of Care: 12  Current functional level related to goals / functional outcomes: unknown   Remaining deficits: unknown   Education / Equipment: HEP Plan:                                                    Patient goals were partially met. Patient is being discharged due to not returning since the last visit.  ?????    SJeral Pinch PT 05/05/16 8:29 AM

## 2017-11-08 IMAGING — DX DG KNEE 1-2V PORT*R*
2 series · 2 of 2 positions shown · non-contrast
Comparison: None.

CLINICAL DATA: Status post reduction of right patellar dislocation.
Initial encounter.

EXAM:
PORTABLE RIGHT KNEE - 1-2 VIEW

[knee ap]
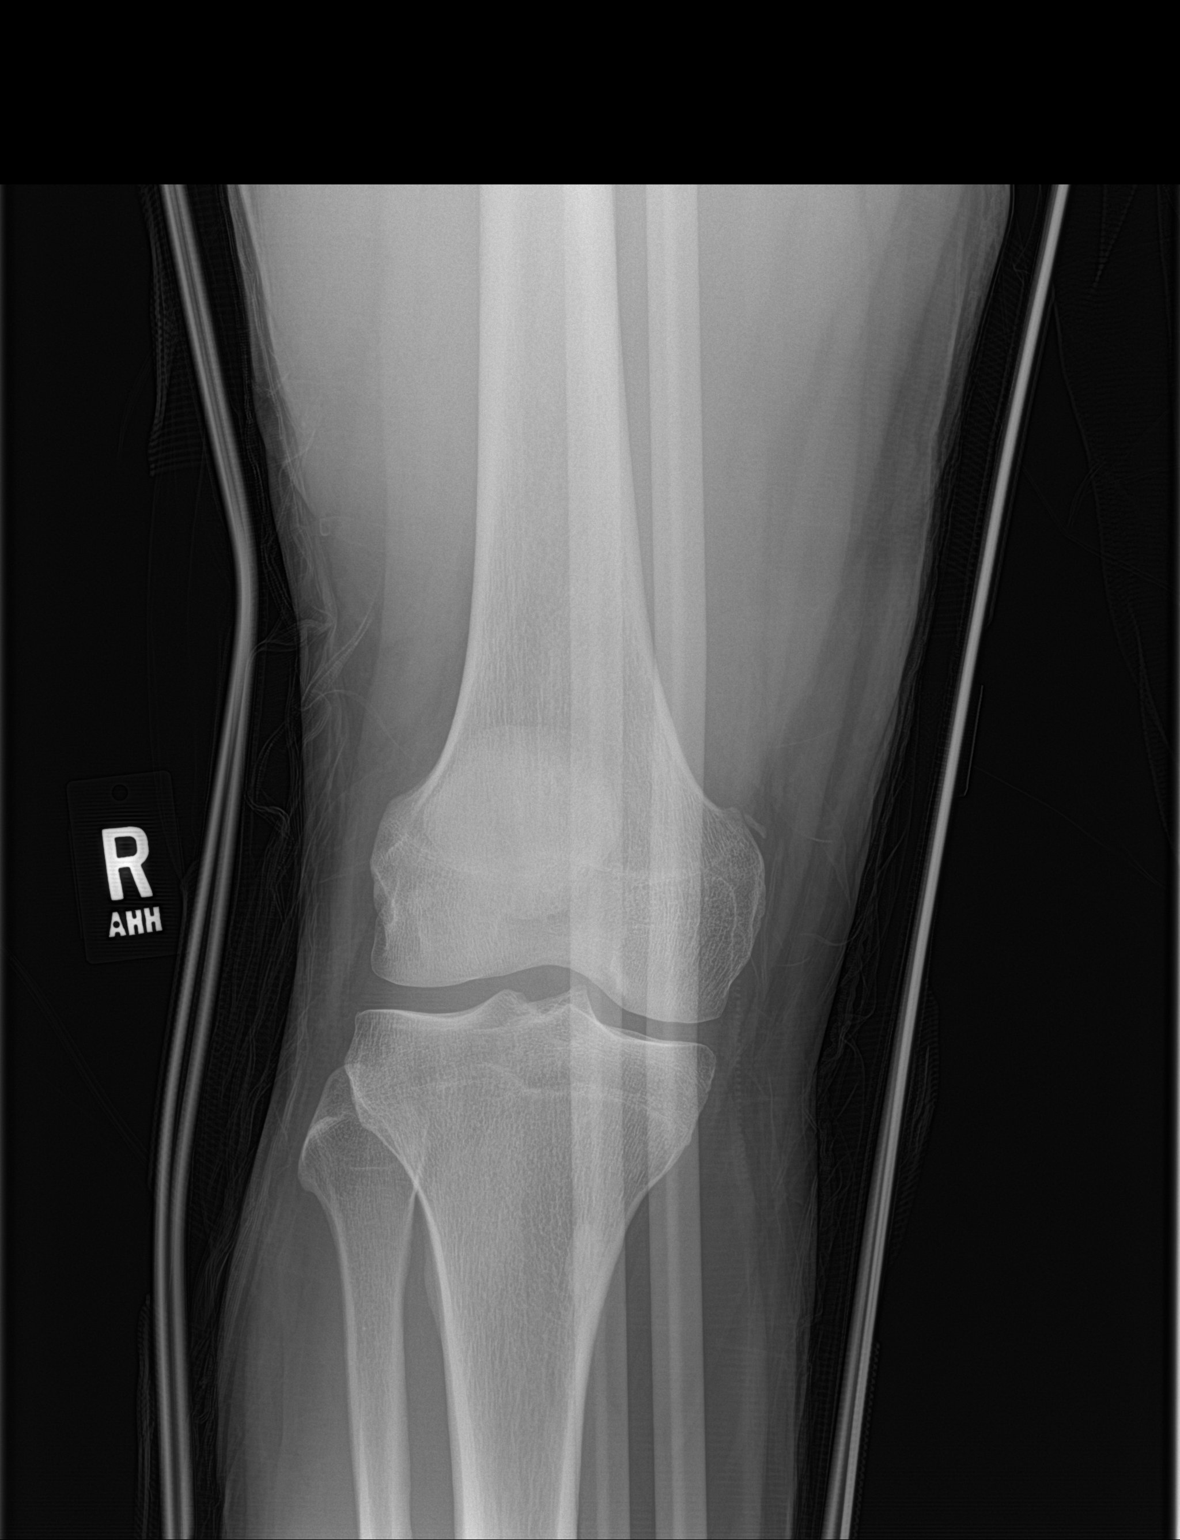

[knee lat]
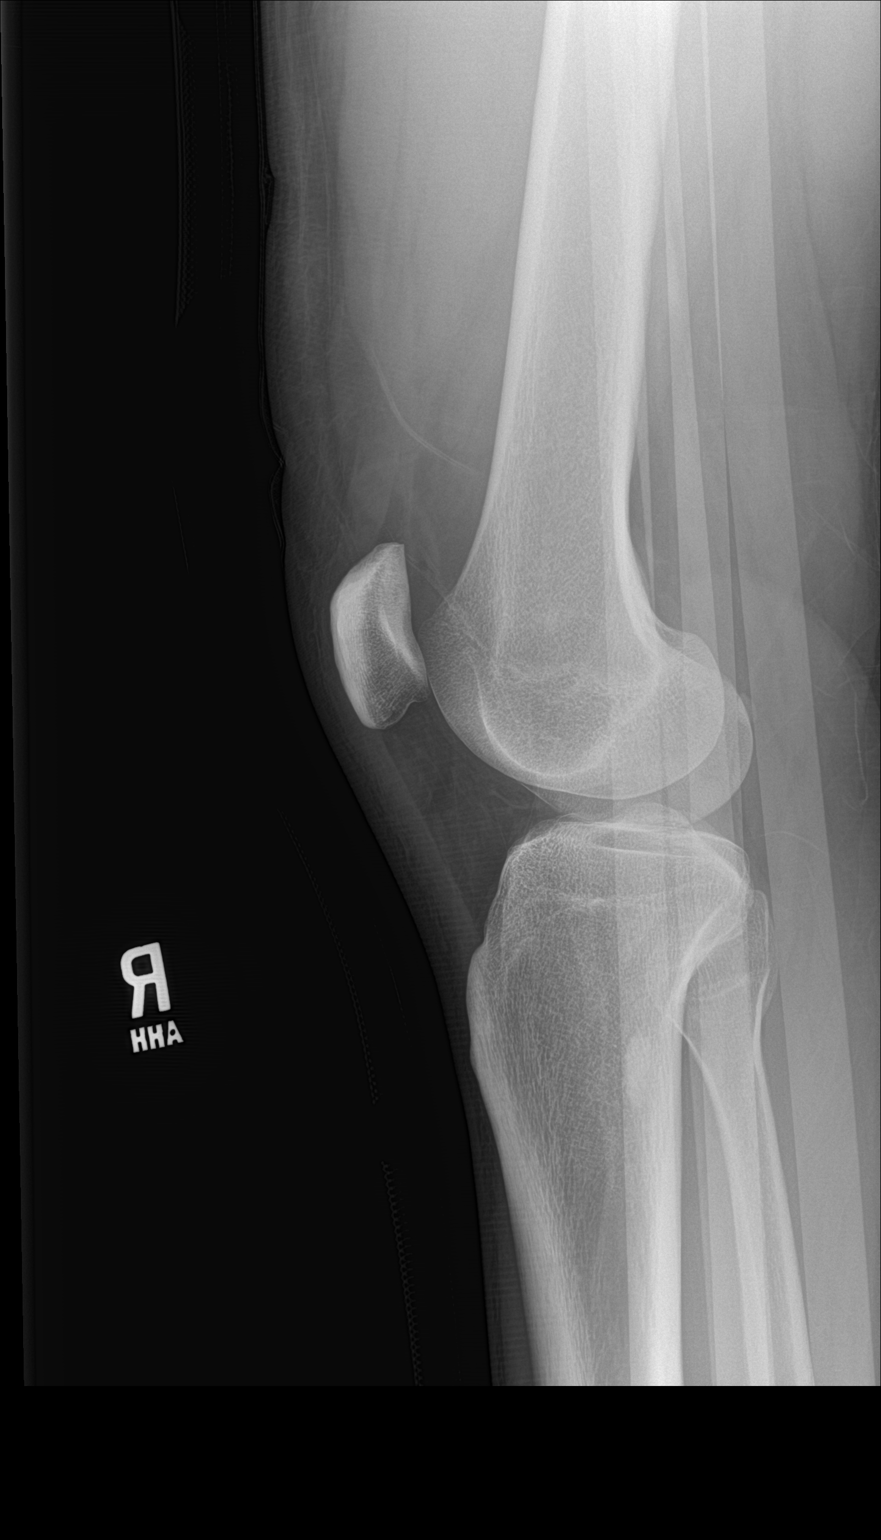

[2 of 2 positions shown; findings below may reference images not displayed]

FINDINGS: There is no evidence of fracture or dislocation at this time. The
joint spaces are preserved. No significant degenerative change is
seen; the patellofemoral joint is grossly unremarkable in
appearance. A small focus of ossification at the medial femoral
condyle likely reflects a small Pellegrini-Stieda lesion, due to
prior medial collateral ligament injury.

No significant joint effusion is seen. The visualized soft tissues
are normal in appearance.
IMPRESSION: 1. No evidence of fracture or dislocation at this time; the patella
is now seen in expected position.
2. Small Pellegrini-Stieda lesion likely reflects prior medial
collateral ligament injury.
